# Patient Record
Sex: Male | Born: 1965
Health system: Southern US, Community
[De-identification: ages and names within clinical notes are randomized; demographics above are authoritative.]

## PROBLEM LIST (undated history)

## (undated) ENCOUNTER — Emergency Department (HOSPITAL_COMMUNITY): Discharge status: Against Medical Advice | Payer: Self-pay

## (undated) DIAGNOSIS — E785 Hyperlipidemia, unspecified: Secondary | ICD-10-CM

## (undated) DIAGNOSIS — T7840XA Allergy, unspecified, initial encounter: Secondary | ICD-10-CM

## (undated) DIAGNOSIS — Z9861 Coronary angioplasty status: Secondary | ICD-10-CM

## (undated) DIAGNOSIS — R51 Headache: Secondary | ICD-10-CM

## (undated) DIAGNOSIS — I10 Essential (primary) hypertension: Secondary | ICD-10-CM

## (undated) DIAGNOSIS — K259 Gastric ulcer, unspecified as acute or chronic, without hemorrhage or perforation: Secondary | ICD-10-CM

## (undated) DIAGNOSIS — K219 Gastro-esophageal reflux disease without esophagitis: Secondary | ICD-10-CM

## (undated) DIAGNOSIS — F32A Depression, unspecified: Secondary | ICD-10-CM

## (undated) DIAGNOSIS — F329 Major depressive disorder, single episode, unspecified: Secondary | ICD-10-CM

## (undated) DIAGNOSIS — R519 Headache, unspecified: Secondary | ICD-10-CM

## (undated) DIAGNOSIS — I2510 Atherosclerotic heart disease of native coronary artery without angina pectoris: Secondary | ICD-10-CM

## (undated) DIAGNOSIS — C801 Malignant (primary) neoplasm, unspecified: Secondary | ICD-10-CM

## (undated) HISTORY — PX: MOUTH SURGERY: SHX715

## (undated) HISTORY — DX: Depression, unspecified: F32.A

## (undated) HISTORY — DX: Gastro-esophageal reflux disease without esophagitis: K21.9

## (undated) HISTORY — DX: Malignant (primary) neoplasm, unspecified: C80.1

## (undated) HISTORY — DX: Atherosclerotic heart disease of native coronary artery without angina pectoris: I25.10

## (undated) HISTORY — DX: Headache, unspecified: R51.9

## (undated) HISTORY — DX: Allergy, unspecified, initial encounter: T78.40XA

## (undated) HISTORY — DX: Gastric ulcer, unspecified as acute or chronic, without hemorrhage or perforation: K25.9

## (undated) HISTORY — DX: Coronary angioplasty status: Z98.61

## (undated) HISTORY — DX: Headache: R51

## (undated) HISTORY — DX: Essential (primary) hypertension: I10

## (undated) HISTORY — DX: Hyperlipidemia, unspecified: E78.5

## (undated) HISTORY — DX: Major depressive disorder, single episode, unspecified: F32.9

---

## 1991-10-15 HISTORY — PX: BACK SURGERY: SHX140

## 2014-06-14 HISTORY — PX: NM MYOVIEW LTD: HXRAD82

## 2014-07-05 DIAGNOSIS — I25119 Atherosclerotic heart disease of native coronary artery with unspecified angina pectoris: Secondary | ICD-10-CM | POA: Insufficient documentation

## 2014-07-14 DIAGNOSIS — Z9861 Coronary angioplasty status: Secondary | ICD-10-CM

## 2014-07-14 DIAGNOSIS — I2510 Atherosclerotic heart disease of native coronary artery without angina pectoris: Secondary | ICD-10-CM

## 2014-07-14 HISTORY — DX: Coronary angioplasty status: Z98.61

## 2014-07-14 HISTORY — PX: CARDIAC CATHETERIZATION: SHX172

## 2014-07-14 HISTORY — PX: CORONARY STENT INTERVENTION: CATH118234

## 2014-07-14 HISTORY — DX: Atherosclerotic heart disease of native coronary artery without angina pectoris: I25.10

## 2017-04-04 ENCOUNTER — Encounter: Payer: Self-pay | Admitting: Primary Care

## 2017-04-04 ENCOUNTER — Ambulatory Visit (INDEPENDENT_AMBULATORY_CARE_PROVIDER_SITE_OTHER): Payer: BLUE CROSS/BLUE SHIELD | Admitting: Primary Care

## 2017-04-04 VITALS — BP 122/78 | HR 85 | Temp 98.0°F | Ht 71.0 in | Wt 235.0 lb

## 2017-04-04 DIAGNOSIS — Z122 Encounter for screening for malignant neoplasm of respiratory organs: Secondary | ICD-10-CM | POA: Diagnosis not present

## 2017-04-04 DIAGNOSIS — L989 Disorder of the skin and subcutaneous tissue, unspecified: Secondary | ICD-10-CM | POA: Diagnosis not present

## 2017-04-04 DIAGNOSIS — I2510 Atherosclerotic heart disease of native coronary artery without angina pectoris: Secondary | ICD-10-CM | POA: Diagnosis not present

## 2017-04-04 DIAGNOSIS — Z720 Tobacco use: Secondary | ICD-10-CM

## 2017-04-04 DIAGNOSIS — N529 Male erectile dysfunction, unspecified: Secondary | ICD-10-CM | POA: Diagnosis not present

## 2017-04-04 LAB — LIPID PANEL
Cholesterol: 216 mg/dL — ABNORMAL HIGH (ref <200)
HDL: 25 mg/dL — AB (ref >40)
Total CHOL/HDL Ratio: 8.6 Ratio — ABNORMAL HIGH (ref <5.0)
Triglycerides: 441 mg/dL — ABNORMAL HIGH (ref <150)

## 2017-04-04 LAB — COMPREHENSIVE METABOLIC PANEL
ALT: 24 U/L (ref 9–46)
AST: 19 U/L (ref 10–40)
Albumin: 4.2 g/dL (ref 3.6–5.1)
Alkaline Phosphatase: 59 U/L (ref 40–115)
BILIRUBIN TOTAL: 0.2 mg/dL (ref 0.2–1.2)
BUN: 19 mg/dL (ref 7–25)
CO2: 22 mmol/L (ref 20–31)
Calcium: 9 mg/dL (ref 8.6–10.3)
Chloride: 106 mmol/L (ref 98–110)
Creat: 1.33 mg/dL (ref 0.60–1.35)
GLUCOSE: 93 mg/dL (ref 65–99)
Potassium: 4 mmol/L (ref 3.5–5.3)
SODIUM: 139 mmol/L (ref 135–146)
Total Protein: 7 g/dL (ref 6.1–8.1)

## 2017-04-04 MED ORDER — SILDENAFIL CITRATE 20 MG PO TABS: ORAL_TABLET | ORAL | 0 refills | Status: DC

## 2017-04-04 NOTE — Progress Notes (Signed)
Subjective:    Patient ID: Miguel Cochran, male    DOB: 1966/04/11, 51 y.o.   MRN: [id number]  HPI  Miguel Cochran is a 51 year old male who presents today to establish care and discuss the problems mentioned below. Will obtain old records. 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   1) Essential Hypertension/CAD: Currently managed on lisinopril/HCTZ 20/25 mg once daily. History of cardiac catheretization in 2014 with placement of two STENT's. He is managed on aspirin daily. He endorses a poor diet and does not exercise. No recent lipid panel on file.  2) GERD: Currently managed on omeprazole 20 mg. He will experience symptoms of esophageal burning and epigastric pain with radiation to his RUQ without his medication. He feels well managed.  3) Erectile Dysfunction: He experiences difficulty obtaining and maintaining an errection that will occur nearaly every time he has intercourse. He's tried several herbal products OTC with little improvement. He's was once managed on Viagra and Cialis in the past with improvement. He would like to try sildenafil.   4) Nevus: Located to right face that has been present for the past 5-6 years. The spot has reduced and grown for several years. The spot will itch, he will scratch, and the spot will bleed. He is a current smoker since the age of 46. He smokes 1 PPD.   Review of Systems  Constitutional: Negative for unexpected weight change.  Eyes: Negative for visual disturbance.  Respiratory: Negative for shortness of breath.   Cardiovascular: Negative for chest pain.  Gastrointestinal:       GERD  Genitourinary:       Erectile dysfunction   Skin:       Skin lesion  Allergic/Immunologic: Negative for environmental allergies.  Neurological: Negative for dizziness and headaches.  Hematological: Negative for adenopathy.       Past Medical History:  Diagnosis Date  . Depression   . Frequent headaches   . GERD (gastroesophageal reflux disease)   .  Hypertension   . Stomach ulcer      Social History   Social History  . Marital status: Single    Spouse name: N/A  . Number of children: N/A  . Years of education: N/A   Occupational History  . Not on file.   Social History Main Topics  . Smoking status: Current Every Day Smoker    Packs/day: 1.50  . Smokeless tobacco: Never Used  . Alcohol use Yes  . Drug use: Unknown  . Sexual activity: Not on file   Other Topics Concern  . Not on file   Social History Narrative   Engaged.    No children.   Works as a Public affairs consultant.    Enjoys fishing, bowling, relaxing.     Past Surgical History:  Procedure Laterality Date  . BACK SURGERY  1993  . MOUTH SURGERY      Family History  Problem Relation Age of Onset  . Alcohol abuse Mother   . Hyperlipidemia Mother   . Hypertension Mother   . Alcohol abuse Father   . Lung cancer Father   . Hyperlipidemia Father   . Hypertension Maternal Grandmother   . Hypertension Maternal Grandfather     No Known Allergies  No current outpatient prescriptions on file prior to visit.   No current facility-administered medications on file prior to visit.     BP 122/78   Pulse 85   Temp 98 F (36.7 C) (Oral)   Ht  5\' 11"  (1.803 m)   Wt 235 lb (106.6 kg)   SpO2 98%   BMI 32.78 kg/m    Objective:   Physical Exam  Constitutional: He is oriented to person, place, and time. He appears well-nourished.  Neck: Neck supple.  Cardiovascular: Normal rate and regular rhythm.   Pulmonary/Chest: Effort normal and breath sounds normal.  Neurological: He is alert and oriented to person, place, and time.  Skin: Skin is warm and dry.  1 cm circular, raised, black colored lesion to right temporal lobe.   Psychiatric: He has a normal mood and affect.          Assessment & Plan:

## 2017-04-04 NOTE — Assessment & Plan Note (Signed)
36 year pack history. No alarm signs.  Not ready to quit. Referral placed for lung cancer screening program. FH of lung cancer in father.

## 2017-04-04 NOTE — Assessment & Plan Note (Signed)
Suspicious for carcinoma, especially in light of tobacco abuse. Referral placed to dermatology for further evaluation.

## 2017-04-04 NOTE — Assessment & Plan Note (Signed)
Unsure of etiology, could be medication induced.  He was successful on both Cialis and Viagra in the past. Rx for sildenafil 20 mg tablets sent to pharmacy. Good Rx card provided for discount.

## 2017-04-04 NOTE — Patient Instructions (Addendum)
Complete lab work prior to leaving today.   You will be contacted regarding your referral for the lung cancer screening and to Dermatology.  Please let us know if you have not heard back within one week.   Start Sildenafil 20 mg tablets for erectile dysfunction. Start by taking 3-5 tablets by mouth 30 minutes prior to intercourse. Consider other pharmacies if the Rx is too expensive at [facility].  I'll be in touch with you regarding your lab results.  It was a pleasure to meet you today! Please don't hesitate to call me with any questions. Welcome to [facility]!

## 2017-04-04 NOTE — Assessment & Plan Note (Signed)
History of stent placement in 2014. Currently on aspirin. Not currently on statin. BP well controlled. Check lipids today. He is asymptomatic and has been there since 2014. Discussed the importance of a healthy diet and regular exercise in order for weight loss, and to reduce the risk of other medical problems. Discussed to stop smoking.

## 2017-04-06 ENCOUNTER — Encounter: Payer: Self-pay | Admitting: Primary Care

## 2017-04-06 DIAGNOSIS — N529 Male erectile dysfunction, unspecified: Secondary | ICD-10-CM

## 2017-04-07 ENCOUNTER — Encounter: Payer: Self-pay | Admitting: Primary Care

## 2017-04-07 DIAGNOSIS — E785 Hyperlipidemia, unspecified: Secondary | ICD-10-CM

## 2017-04-07 MED ORDER — SILDENAFIL CITRATE 20 MG PO TABS: ORAL_TABLET | ORAL | 0 refills | Status: DC

## 2017-04-08 MED ORDER — ATORVASTATIN CALCIUM 20 MG PO TABS: 20.0000 mg | ORAL_TABLET | Freq: Every day | ORAL | 3 refills | Status: DC

## 2017-04-08 NOTE — Telephone Encounter (Signed)
Pt left v/m requesting generic Lipitor sent to [facility]. Pt request cb when done [phone]).

## 2017-04-08 NOTE — Telephone Encounter (Signed)
Spoken to patient and schedule lab appt on 07/11/2017

## 2017-04-08 NOTE — Telephone Encounter (Signed)
See My Chart message. Please set patient up for repeat lipids in three months, lab only, fasting appointment.

## 2017-04-24 ENCOUNTER — Other Ambulatory Visit: Payer: Self-pay | Admitting: Primary Care

## 2017-04-24 DIAGNOSIS — N529 Male erectile dysfunction, unspecified: Secondary | ICD-10-CM

## 2017-04-24 MED ORDER — SILDENAFIL CITRATE 20 MG PO TABS: ORAL_TABLET | ORAL | 0 refills | Status: DC

## 2017-05-24 ENCOUNTER — Other Ambulatory Visit: Payer: Self-pay | Admitting: Primary Care

## 2017-05-24 DIAGNOSIS — N529 Male erectile dysfunction, unspecified: Secondary | ICD-10-CM

## 2017-05-24 NOTE — Telephone Encounter (Signed)
Last filled 04/24/17--please advise

## 2017-05-25 NOTE — Telephone Encounter (Signed)
Is he taking this medication daily or just as needed? Does he need an actual refill?

## 2017-05-26 ENCOUNTER — Encounter: Payer: Self-pay | Admitting: Primary Care

## 2017-05-26 DIAGNOSIS — N529 Male erectile dysfunction, unspecified: Secondary | ICD-10-CM

## 2017-05-26 NOTE — Telephone Encounter (Signed)
Patient called back. Patient lately have been taking this medication daily. Patient needs a refill.

## 2017-05-28 MED ORDER — TADALAFIL 5 MG PO TABS: ORAL_TABLET | ORAL | 0 refills | Status: DC

## 2017-05-28 NOTE — Telephone Encounter (Signed)
See MyChart messages.

## 2017-05-29 ENCOUNTER — Ambulatory Visit (INDEPENDENT_AMBULATORY_CARE_PROVIDER_SITE_OTHER): Payer: BLUE CROSS/BLUE SHIELD | Admitting: Internal Medicine

## 2017-05-29 ENCOUNTER — Encounter: Payer: Self-pay | Admitting: Internal Medicine

## 2017-05-29 DIAGNOSIS — F32A Depression, unspecified: Secondary | ICD-10-CM | POA: Insufficient documentation

## 2017-05-29 DIAGNOSIS — F329 Major depressive disorder, single episode, unspecified: Secondary | ICD-10-CM | POA: Insufficient documentation

## 2017-05-29 DIAGNOSIS — J309 Allergic rhinitis, unspecified: Secondary | ICD-10-CM

## 2017-05-29 DIAGNOSIS — K219 Gastro-esophageal reflux disease without esophagitis: Secondary | ICD-10-CM | POA: Insufficient documentation

## 2017-05-29 DIAGNOSIS — R062 Wheezing: Secondary | ICD-10-CM | POA: Insufficient documentation

## 2017-05-29 DIAGNOSIS — J019 Acute sinusitis, unspecified: Secondary | ICD-10-CM | POA: Diagnosis not present

## 2017-05-29 DIAGNOSIS — I10 Essential (primary) hypertension: Secondary | ICD-10-CM | POA: Insufficient documentation

## 2017-05-29 MED ORDER — HYDROCODONE-HOMATROPINE 5-1.5 MG/5ML PO SYRP: 5.0000 mL | ORAL_SOLUTION | Freq: Four times a day (QID) | ORAL | 0 refills | Status: AC | PRN

## 2017-05-29 MED ORDER — LEVOFLOXACIN 500 MG PO TABS: 500.0000 mg | ORAL_TABLET | Freq: Every day | ORAL | 0 refills | Status: AC

## 2017-05-29 MED ORDER — METHYLPREDNISOLONE ACETATE 80 MG/ML IJ SUSP
80.0000 mg | Freq: Once | INTRAMUSCULAR | Status: AC
  Administered 2017-05-29: 80 mg via INTRAMUSCULAR

## 2017-05-29 MED ORDER — PREDNISONE 10 MG PO TABS: ORAL_TABLET | ORAL | 0 refills | Status: DC

## 2017-05-29 MED ORDER — ALBUTEROL SULFATE HFA 108 (90 BASE) MCG/ACT IN AERS: 2.0000 | INHALATION_SPRAY | Freq: Four times a day (QID) | RESPIRATORY_TRACT | 2 refills | Status: DC | PRN

## 2017-05-29 NOTE — Progress Notes (Signed)
Subjective:    Patient ID: Miguel Cochran, male    DOB: 09-03-1966, 51 y.o.   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 Yesterday installed one and the situation was an older car with massive dust and debris involved more than usual.  Did not wear any mask or respirator b/c not normally required.  Immediately had onset large sinus and facial congestoin, pressure, drainage, cough and mild wheezing without sob  Not better with zyrtec, xyzal, benadryl and nasacort.  Nothing else makes better or worse.  Believes he likely had a fever earlier today.   Past Medical History:  Diagnosis Date  . Depression   . Frequent headaches   . GERD (gastroesophageal reflux disease)   . Hypertension   . Stomach ulcer    Past Surgical History:  Procedure Laterality Date  . BACK SURGERY  1993  . MOUTH SURGERY      reports that he has been smoking.  He has been smoking about 1.50 packs per day. He has never used smokeless tobacco. He reports that he drinks alcohol. His drug history is not on file. family history includes Alcohol abuse in his father and mother; Hyperlipidemia in his father and mother; Hypertension in his maternal grandfather, maternal grandmother, and mother; Lung cancer in his father. No Known Allergies Current Outpatient Prescriptions on File Prior to Visit  Medication Sig Dispense Refill  . Aspirin-Salicylamide-Caffeine (BC HEADACHE POWDER PO) Take by mouth.    Marland Kitchen atorvastatin (LIPITOR) 20 MG tablet Take 1 tablet (20 mg total) by mouth daily. 90 tablet 3  . b complex vitamins tablet Take 1 tablet by mouth daily.    Marland Kitchen lisinopril-hydrochlorothiazide (PRINZIDE,ZESTORETIC) 20-25 MG tablet Take 1 tablet by mouth daily.    Marland Kitchen omeprazole (PRILOSEC) 20 MG capsule Take 20 mg by mouth daily.    . tadalafil (CIALIS) 5 MG tablet Take 1/2  tablet by mouth daily for erectile dysfunction. May increase to 1 full tablet daily after one week if needed. 30 tablet 0   No current facility-administered medications on file prior to visit.    Review of Systems  Constitutional: Negative for other unusual diaphoresis or sweats HENT: Negative for ear discharge or swelling Eyes: Negative for other worsening visual disturbances Respiratory: Negative for stridor or other swelling  Gastrointestinal: Negative for worsening distension or other blood Genitourinary: Negative for retention or other urinary change Musculoskeletal: Negative for other MSK pain or swelling Skin: Negative for color change or other new lesions Neurological: Negative for worsening tremors and other numbness  Psychiatric/Behavioral: Negative for worsening agitation or other fatigue All other system neg per pt    Objective:   Physical Exam BP 118/82   Pulse 84   Temp 97.7 F (36.5 C)   Ht 5\' 11"  (1.803 m)   Wt 244 lb (110.7 kg)   SpO2 99%   BMI 34.03 kg/m  VS noted, mild ill Constitutional: Pt appears in NAD HENT: Head: NCAT.  Right Ear: External ear normal.  Left Ear: External ear normal.  Eyes: . Pupils are equal, round, and reactive to light. Conjunctivae and EOM are normal Nose: without d/c or deformity Bilat tm's with mild erythema.  Max sinus areas mild tender.  Pharynx with mild erythema, no exudate Neck: Neck supple. Gross normal ROM Cardiovascular: Normal rate and regular rhythm.  Pulmonary/Chest: Effort normal and breath sounds decresaed without rales but with few mild scattered wheezing.  Neurological: Pt is alert. At baseline orientation, motor grossly intact Skin: Skin is warm. No rashes, other new lesions, no LE edema Psychiatric: Pt behavior is normal without agitation  No other exam findings    Assessment & Plan:

## 2017-05-29 NOTE — Patient Instructions (Addendum)
You had the steroid shot today  Please take all new medication as prescribed - the antibiotic, prednisone, cough medicine, and inhaler if needed  Please continue all other medications as before, and refills have been done if requested.  Please have the pharmacy call with any other refills you may need.  Please keep your appointments with your specialists as you may have planned

## 2017-05-29 NOTE — Assessment & Plan Note (Signed)
Mild to mod, for antibx course,  to f/u any worsening symptoms or concerns 

## 2017-05-29 NOTE — Assessment & Plan Note (Addendum)
Mod to severe, for depomedrol 80 IM,  to f/u any worsening symptoms or concerns

## 2017-05-29 NOTE — Assessment & Plan Note (Signed)
Mild, for predpac asd,  to f/u any worsening symptoms or concerns 

## 2017-05-30 ENCOUNTER — Encounter: Payer: Self-pay | Admitting: Primary Care

## 2017-06-05 ENCOUNTER — Telehealth: Payer: Self-pay | Admitting: Primary Care

## 2017-06-05 ENCOUNTER — Encounter: Payer: Self-pay | Admitting: Primary Care

## 2017-06-05 NOTE — Telephone Encounter (Signed)
Received [employer] form, completed and messaged patient via My Chart that this is ready for pick up.

## 2017-06-14 ENCOUNTER — Encounter: Payer: Self-pay | Admitting: Primary Care

## 2017-06-15 ENCOUNTER — Emergency Department (HOSPITAL_COMMUNITY): Payer: BLUE CROSS/BLUE SHIELD

## 2017-06-15 ENCOUNTER — Encounter (HOSPITAL_COMMUNITY): Payer: Self-pay | Admitting: Emergency Medicine

## 2017-06-15 ENCOUNTER — Emergency Department (HOSPITAL_COMMUNITY)
Admission: EM | Admit: 2017-06-15 | Discharge: 2017-06-16 | Disposition: A | Discharge status: Home / Self Care | Payer: BLUE CROSS/BLUE SHIELD | Attending: Emergency Medicine | Admitting: Emergency Medicine

## 2017-06-15 DIAGNOSIS — F17200 Nicotine dependence, unspecified, uncomplicated: Secondary | ICD-10-CM | POA: Diagnosis not present

## 2017-06-15 DIAGNOSIS — Z79899 Other long term (current) drug therapy: Secondary | ICD-10-CM | POA: Diagnosis not present

## 2017-06-15 DIAGNOSIS — I10 Essential (primary) hypertension: Secondary | ICD-10-CM | POA: Diagnosis not present

## 2017-06-15 DIAGNOSIS — R55 Syncope and collapse: Secondary | ICD-10-CM | POA: Insufficient documentation

## 2017-06-15 DIAGNOSIS — Z5329 Procedure and treatment not carried out because of patient's decision for other reasons: Secondary | ICD-10-CM | POA: Diagnosis not present

## 2017-06-15 DIAGNOSIS — I2510 Atherosclerotic heart disease of native coronary artery without angina pectoris: Secondary | ICD-10-CM | POA: Diagnosis not present

## 2017-06-15 DIAGNOSIS — Z955 Presence of coronary angioplasty implant and graft: Secondary | ICD-10-CM | POA: Diagnosis not present

## 2017-06-15 DIAGNOSIS — R0602 Shortness of breath: Secondary | ICD-10-CM | POA: Insufficient documentation

## 2017-06-15 LAB — URINALYSIS, ROUTINE W REFLEX MICROSCOPIC
Bilirubin Urine: NEGATIVE
Glucose, UA: NEGATIVE mg/dL
HGB URINE DIPSTICK: NEGATIVE
KETONES UR: NEGATIVE mg/dL
LEUKOCYTES UA: NEGATIVE
Nitrite: NEGATIVE
PROTEIN: NEGATIVE mg/dL
Specific Gravity, Urine: 1.012 (ref 1.005–1.030)
pH: 7 (ref 5.0–8.0)

## 2017-06-15 LAB — BASIC METABOLIC PANEL
Anion gap: 10 (ref 5–15)
BUN: 19 mg/dL (ref 6–20)
CALCIUM: 8.7 mg/dL — AB (ref 8.9–10.3)
CO2: 22 mmol/L (ref 22–32)
CREATININE: 1.14 mg/dL (ref 0.61–1.24)
Chloride: 104 mmol/L (ref 101–111)
GFR calc non Af Amer: >60 mL/min (ref >60)
Glucose, Bld: 123 mg/dL — ABNORMAL HIGH (ref 65–99)
Potassium: 3.7 mmol/L (ref 3.5–5.1)
SODIUM: 136 mmol/L (ref 135–145)

## 2017-06-15 LAB — TROPONIN I

## 2017-06-15 LAB — D-DIMER, QUANTITATIVE: D-Dimer, Quant: <0 ug/mL-FEU — ABNORMAL LOW (ref 0.00–0.50)

## 2017-06-15 LAB — RAPID URINE DRUG SCREEN, HOSP PERFORMED
AMPHETAMINES: NOT DETECTED
Barbiturates: NOT DETECTED
Benzodiazepines: NOT DETECTED
Cocaine: NOT DETECTED
OPIATES: NOT DETECTED
Tetrahydrocannabinol: POSITIVE — AB

## 2017-06-15 LAB — CBC
HCT: 44 % (ref 39.0–52.0)
Hemoglobin: 15.7 g/dL (ref 13.0–17.0)
MCH: 33.6 pg (ref 26.0–34.0)
MCHC: 35.7 g/dL (ref 30.0–36.0)
MCV: 94.2 fL (ref 78.0–100.0)
PLATELETS: 219 10*3/uL (ref 150–400)
RBC: 4.67 MIL/uL (ref 4.22–5.81)
RDW: 12.8 % (ref 11.5–15.5)
WBC: 11.4 10*3/uL — AB (ref 4.0–10.5)

## 2017-06-15 NOTE — ED Notes (Signed)
Out of bed to BY for UA

## 2017-06-15 NOTE — ED Notes (Signed)
Received report on pt, pt updated on plan of care, fan provided for comfort due to heat in room

## 2017-06-15 NOTE — ED Notes (Signed)
Out of bed for BR upon return HR 86  Then decreased to 77 and up to 83

## 2017-06-15 NOTE — ED Provider Notes (Signed)
[facility] DEPT Provider Note   CSN: [id number] Arrival date & time: 06/15/17  1943     History   Chief Complaint Chief Complaint  Patient presents with  . Loss of Consciousness    HPI Miguel Cochran is a 51 y.o. male.  HPI   Miguel Cochran is a 51 y.o. male with hx of CAD with stent placed,  presents to the Emergency Department complaining of intermittent, brief episodes of "passing out" symptoms present for two weeks.  He states that he feels his heart beat slow down just before the episodes occur and it speeds up and associated with some shortness of breath.  Episodes have been witnessed by his significant other.  He describes them as lasting from a few seconds to a minute.  He states that he has never experienced episodes like this before.  He also admits to working excessively long hours for the last two weeks.  He denies drug or alcohol use, chest pain, numbness or weakness of the extremities.      Past Medical History:  Diagnosis Date  . Coronary artery disease   . Depression   . Frequent headaches   . GERD (gastroesophageal reflux disease)   . History of cardiac cath   . Hypertension   . Stomach ulcer     Patient Active Problem List   Diagnosis Date Noted  . Acute sinus infection 05/29/2017  . Allergic rhinitis 05/29/2017  . Wheezing 05/29/2017  . GERD (gastroesophageal reflux disease)   . Depression   . Hypertension   . Coronary artery disease involving native heart without angina pectoris 04/04/2017  . Erectile dysfunction 04/04/2017  . Skin lesion of face 04/04/2017  . Tobacco abuse 04/04/2017    Past Surgical History:  Procedure Laterality Date  . BACK SURGERY  1993  . CARDIAC SURGERY     2 stents  . MOUTH SURGERY         Home Medications    Prior to Admission medications   Medication Sig Start Date End Date Taking? Authorizing Provider  albuterol (PROVENTIL HFA;VENTOLIN HFA) 108 (90 Base) MCG/ACT inhaler Inhale 2 puffs into the lungs  every 6 (six) hours as needed for wheezing or shortness of breath. 05/29/17  Yes Biagio Borg, MD  Aspirin-Salicylamide-Caffeine [facility] HEADACHE POWDER PO) Take 1 packet by mouth daily.    Yes [provider]  atorvastatin (LIPITOR) 20 MG tablet Take 1 tablet (20 mg total) by mouth daily. 04/08/17  Yes Pleas Koch, NP  b complex vitamins tablet Take 1 tablet by mouth daily.   Yes [provider]  lisinopril-hydrochlorothiazide (PRINZIDE,ZESTORETIC) 20-25 MG tablet Take 1 tablet by mouth daily.   Yes [provider]  omeprazole (PRILOSEC) 20 MG capsule Take 20 mg by mouth daily.   Yes [provider]  predniSONE (DELTASONE) 10 MG tablet 3 tabs by mouth per day for 3 days,2tabs per day for 3 days,1tab per day for 3 days Patient not taking: Reported on 06/15/2017 05/29/17   Biagio Borg, MD  tadalafil (CIALIS) 5 MG tablet Take 1/2 tablet by mouth daily for erectile dysfunction. May increase to 1 full tablet daily after one week if needed. Patient taking differently: Take 2.5-5 mg by mouth daily as needed for erectile dysfunction. Take 1/2 tablet by mouth daily for erectile dysfunction. May increase to 1 full tablet daily after one week if needed. 05/28/17   Pleas Koch, NP    Family History Family History  Problem Relation  Age of Onset  . Alcohol abuse Mother   . Hyperlipidemia Mother   . Hypertension Mother   . Alcohol abuse Father   . Lung cancer Father   . Hyperlipidemia Father   . Hypertension Maternal Grandmother   . Hypertension Maternal Grandfather     Social History Social History  Substance Use Topics  . Smoking status: Current Every Day Smoker    Packs/day: 1.00  . Smokeless tobacco: Never Used  . Alcohol use No     Allergies   Patient has no known allergies.   Review of Systems Review of Systems  Constitutional: Negative for activity change, appetite change and chills.  Eyes: Negative for visual disturbance.  Respiratory:  Positive for shortness of breath. Negative for cough and chest tightness.   Cardiovascular: Negative for chest pain.  Gastrointestinal: Negative for abdominal pain, nausea and vomiting.  Genitourinary: Negative for dysuria and flank pain.  Musculoskeletal: Negative for arthralgias, neck pain and neck stiffness.  Skin: Negative for rash.  Neurological: Positive for syncope. Negative for dizziness, tremors, weakness, numbness and headaches.  Psychiatric/Behavioral: Negative for confusion and decreased concentration. The patient is not nervous/anxious.      Physical Exam Updated Vital Signs BP 116/72   Pulse 82   Temp 98.7 F (37.1 C) (Oral)   Resp 19   Ht 5\' 11"  (1.803 m)   Wt 110.7 kg (244 lb)   SpO2 96%   BMI 34.03 kg/m   Physical Exam  Constitutional: He is oriented to person, place, and time. He appears well-developed and well-nourished. No distress.  HENT:  Head: Atraumatic.  Mouth/Throat: Oropharynx is clear and moist.  Eyes: Pupils are equal, round, and reactive to light. EOM are normal.  Neck: Normal range of motion. Neck supple. No JVD present.  Cardiovascular: Normal rate, regular rhythm, normal heart sounds and intact distal pulses.  Exam reveals no friction rub.   No murmur heard. Pulmonary/Chest: Effort normal and breath sounds normal. No respiratory distress.  Abdominal: Soft. He exhibits no distension and no mass. There is no tenderness. There is no guarding.  Musculoskeletal: Normal range of motion.  Lymphadenopathy:    He has no cervical adenopathy.  Neurological: He is alert and oriented to person, place, and time. He has normal strength. No sensory deficit. Gait normal. GCS eye subscore is 4. GCS verbal subscore is 5. GCS motor subscore is 6.  CN III-XII intact  Skin: Skin is warm. Capillary refill takes less than 2 seconds. No rash noted.  Psychiatric: He has a normal mood and affect.  Nursing note and vitals reviewed.    ED Treatments / Results   Labs (all labs ordered are listed, but only abnormal results are displayed) Labs Reviewed  BASIC METABOLIC PANEL - Abnormal; Notable for the following:       Result Value   Glucose, Bld 123 (*)    Calcium 8.7 (*)    All other components within normal limits  CBC - Abnormal; Notable for the following:    WBC 11.4 (*)    All other components within normal limits  RAPID URINE DRUG SCREEN, HOSP PERFORMED - Abnormal; Notable for the following:    Tetrahydrocannabinol POSITIVE (*)    All other components within normal limits  D-DIMER, QUANTITATIVE (NOT AT [facility]) - Abnormal; Notable for the following:    D-Dimer, Quant <0.00 (*)    All other components within normal limits  URINALYSIS, ROUTINE W REFLEX MICROSCOPIC  TROPONIN I    EKG  EKG  Interpretation  Date/Time:  Sunday June 15 2017 21:11:59 EDT Ventricular Rate:  78 PR Interval:    QRS Duration: 91 QT Interval:  400 QTC Calculation: 456 R Axis:   -6 Text Interpretation:  Sinus rhythm Low voltage, precordial leads Abnormal R-wave progression, early transition Baseline wander No old tracing to compare Confirmed by Francine Graven [phone]) on 06/15/2017 10:21:08 PM       Radiology Dg Chest 2 View  Result Date: 06/15/2017 CLINICAL DATA:  Recurrent syncope. EXAM: CHEST  2 VIEW COMPARISON:  None. FINDINGS: The cardiomediastinal contours are normal. The lungs are clear. Pulmonary vasculature is normal. No consolidation, pleural effusion, or pneumothorax. No acute osseous abnormalities are seen. IMPRESSION: No acute pulmonary process. Electronically Signed   By: Jeb Levering M.D.   On: 06/15/2017 23:20   Ct Head Wo Contrast  Result Date: 06/15/2017 CLINICAL DATA:  Recurrent syncope. EXAM: CT HEAD WITHOUT CONTRAST TECHNIQUE: Contiguous axial images were obtained from the base of the skull through the vertex without intravenous contrast. COMPARISON:  None. FINDINGS: Brain: No intracranial hemorrhage, mass effect, or midline shift.  No hydrocephalus. The basilar cisterns are patent. No evidence of territorial infarct or acute ischemia. No extra-axial or intracranial fluid collection. Vascular: Atherosclerosis of skullbase vasculature without hyperdense vessel or abnormal calcification. Skull: Normal. Negative for fracture or focal lesion. Sinuses/Orbits: Paranasal sinuses and mastoid air cells are clear. The visualized orbits are unremarkable. Other: None. IMPRESSION: No acute intracranial abnormality. Intracranial atherosclerosis, incidentally noted. Electronically Signed   By: Jeb Levering M.D.   On: 06/15/2017 23:07    Procedures Procedures (including critical care time)  Medications Ordered in ED Medications - No data to display   Initial Impression / Assessment and Plan / ED Course  I have reviewed the triage vital signs and the nursing notes.  Pertinent labs & imaging results that were available during my care of the patient were reviewed by me and considered in my medical decision making (see chart for details).     Pt is well appearing.  Vitals wnml.  He has ambulated to the restroom w/o difficulty  0030  Consulted Dr. Radford Pax with cardiology.  She recommends pt to be admitted for monitor and cards f/u on Tuesday,.  0035 Discussed recommendation with patient and he refuses admission stating that he has to work tomorrow and cannot stay.  I have offered a work note and thoroughly discussed risks involved with leaving including death. Pt verbalized understanding and states that he will call his PCP and arrange cardiology appt.    Final Clinical Impressions(s) / ED Diagnoses   Final diagnoses:  Syncope, unspecified syncope type    New Prescriptions New Prescriptions   No medications on file     Kem Parkinson, PA-C 06/16/17 0111    Francine Graven, DO 06/18/17 1948

## 2017-06-15 NOTE — ED Triage Notes (Signed)
Pt states that he has had multiple syncopal episodes in the last 2 weeks

## 2017-06-15 NOTE — ED Notes (Signed)
Pt reports that he has been passing out several times a day for the last several weeks- Often while driving- he reports "zoning out" and when driving, and speaking with his SO He feels as though his heart races Today he texted his cardiologist office and the PA there suggested he come to the ED for evaluation

## 2017-06-15 NOTE — ED Notes (Signed)
From radiology 

## 2017-06-16 NOTE — ED Notes (Signed)
Tammy PA at bedside speaking with pt

## 2017-06-16 NOTE — ED Notes (Signed)
Pt verbalizes understanding of risks of leaving AMA. Pt alert and oriented x 4, denies pain or dizziness, steady gait.

## 2017-06-16 NOTE — ED Notes (Signed)
Tammy PA at bedside

## 2017-06-16 NOTE — ED Notes (Signed)
IV access 20 gauge to left wrist discontinued- cath intact- bleeding controlled.

## 2017-06-17 ENCOUNTER — Telehealth: Payer: Self-pay

## 2017-06-17 NOTE — Telephone Encounter (Signed)
It sounds like he needs to see cardiology. Can we get him rescheduled for this week? Rosaria Ferries, see note below. Do we need a new referral?

## 2017-06-17 NOTE — Telephone Encounter (Signed)
Pt left v/m requesting cb about getting a cardiology referral. Please seen [facility] ED note on 09/*02/18. Pt was advised at [facility] ED that Dr Radford Pax cardiologist had advised pt to be admitted and f/u with cards on 06/17/17. Pt could not miss work and left AMA. Do you want to see pt before card referral or what to do? Pt could not come in to see Allie Bossier NP until 06/20/17.Please advise.

## 2017-06-18 NOTE — Telephone Encounter (Signed)
Yes we will need a Cardiology REferral placed for this patient, then we can call around to schedule. Please place referral.

## 2017-06-19 ENCOUNTER — Other Ambulatory Visit: Payer: Self-pay | Admitting: Primary Care

## 2017-06-19 DIAGNOSIS — R55 Syncope and collapse: Secondary | ICD-10-CM

## 2017-06-19 NOTE — Telephone Encounter (Signed)
Noted, referral placed. Thanks!

## 2017-06-19 NOTE — Telephone Encounter (Signed)
Called patient and had to [facility]

## 2017-06-22 ENCOUNTER — Encounter: Payer: Self-pay | Admitting: Primary Care

## 2017-06-22 DIAGNOSIS — N529 Male erectile dysfunction, unspecified: Secondary | ICD-10-CM

## 2017-06-24 NOTE — Telephone Encounter (Signed)
Patient is requesting refill.  Also I have place the form in Miguel Cochran's inbox

## 2017-06-26 MED ORDER — TADALAFIL 5 MG PO TABS: ORAL_TABLET | ORAL | 5 refills | Status: DC

## 2017-06-26 NOTE — Telephone Encounter (Signed)
Pt left v/m requesting cb [phone] about question sent on sildenafil refill. Please advise.

## 2017-06-26 NOTE — Telephone Encounter (Signed)
Form completed and placed on Chan's desk. Looks like he needs to complete a section.

## 2017-06-30 ENCOUNTER — Encounter: Payer: Self-pay | Admitting: Primary Care

## 2017-06-30 ENCOUNTER — Other Ambulatory Visit: Payer: Self-pay | Admitting: Primary Care

## 2017-06-30 DIAGNOSIS — N529 Male erectile dysfunction, unspecified: Secondary | ICD-10-CM

## 2017-06-30 MED ORDER — TADALAFIL 5 MG PO TABS: ORAL_TABLET | ORAL | 3 refills | Status: DC

## 2017-06-30 MED ORDER — SILDENAFIL CITRATE 20 MG PO TABS: ORAL_TABLET | ORAL | 0 refills | Status: DC

## 2017-06-30 NOTE — Telephone Encounter (Signed)
Pt left v/m requesting cb at [phone].

## 2017-06-30 NOTE — Telephone Encounter (Signed)
Pt left vm requesting cb on [phone].

## 2017-06-30 NOTE — Addendum Note (Signed)
Addended by: Jacqualin Combes on: 06/30/2017 01:01 PM   Modules accepted: Orders

## 2017-06-30 NOTE — Telephone Encounter (Signed)
Can you please provide him with an update? Thanks.

## 2017-07-01 NOTE — Telephone Encounter (Signed)
Already respond to patient regarding this form

## 2017-07-11 ENCOUNTER — Other Ambulatory Visit (INDEPENDENT_AMBULATORY_CARE_PROVIDER_SITE_OTHER): Payer: BLUE CROSS/BLUE SHIELD

## 2017-07-11 DIAGNOSIS — E785 Hyperlipidemia, unspecified: Secondary | ICD-10-CM | POA: Diagnosis not present

## 2017-07-11 LAB — LIPID PANEL
CHOL/HDL RATIO: 4
Cholesterol: 138 mg/dL (ref 0–200)
HDL: 34.1 mg/dL — ABNORMAL LOW (ref >39.00)
LDL CALC: 72 mg/dL (ref 0–99)
NonHDL: 104.21
Triglycerides: 160 mg/dL — ABNORMAL HIGH (ref 0.0–149.0)
VLDL: 32 mg/dL (ref 0.0–40.0)

## 2017-07-11 LAB — HEPATIC FUNCTION PANEL
ALT: 46 U/L (ref 0–53)
AST: 27 U/L (ref 0–37)
Albumin: 4.1 g/dL (ref 3.5–5.2)
Alkaline Phosphatase: 60 U/L (ref 39–117)
BILIRUBIN DIRECT: 0.1 mg/dL (ref 0.0–0.3)
BILIRUBIN TOTAL: 0.3 mg/dL (ref 0.2–1.2)
Total Protein: 7.2 g/dL (ref 6.0–8.3)

## 2017-07-18 ENCOUNTER — Ambulatory Visit (INDEPENDENT_AMBULATORY_CARE_PROVIDER_SITE_OTHER): Payer: BLUE CROSS/BLUE SHIELD | Admitting: Cardiology

## 2017-07-18 ENCOUNTER — Encounter: Payer: Self-pay | Admitting: Cardiology

## 2017-07-18 VITALS — BP 128/78 | HR 76 | Ht 71.0 in | Wt 248.0 lb

## 2017-07-18 DIAGNOSIS — E785 Hyperlipidemia, unspecified: Secondary | ICD-10-CM

## 2017-07-18 DIAGNOSIS — Z720 Tobacco use: Secondary | ICD-10-CM | POA: Diagnosis not present

## 2017-07-18 DIAGNOSIS — R55 Syncope and collapse: Secondary | ICD-10-CM | POA: Diagnosis not present

## 2017-07-18 DIAGNOSIS — G4719 Other hypersomnia: Secondary | ICD-10-CM | POA: Insufficient documentation

## 2017-07-18 DIAGNOSIS — I25119 Atherosclerotic heart disease of native coronary artery with unspecified angina pectoris: Secondary | ICD-10-CM

## 2017-07-18 DIAGNOSIS — I10 Essential (primary) hypertension: Secondary | ICD-10-CM

## 2017-07-18 NOTE — Progress Notes (Signed)
PCP: Miguel Koch, NP, Miguel Cower, MD  Clinic Note: Chief Complaint  Patient presents with  . New Patient (Initial Visit)  . Coronary Artery Disease  . Sleeping Problem    ? OSA    HPI: Miguel Cochran is a 51 y.o. male who is being seen today for the evaluation of poor sleep & h/o CAD-PCI (in [place]). at the request of Miguel Koch, NP. - He has a history of CAD noted back in 2015 when he had a stress test performed for exertional dyspnea and chest pain. Following this he underwent PCI to OM 2 & has pRCA CTO. --> Was essentially lost to follow-up after one visit following PCI. - He has hypertension and erectile dysfunction  Miguel Cochran was first seen by his Miguel Koch, NP on June 22nd 2 establish care.  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  Recent Hospitalizations: ER visit for (Near) syncope 06/15/2017 --> noted intermittent brief episodes of "passing out" lasting only a few seconds.  Says that his HR seems to slow before these spell kick in -- no Sx.  Was working long/ difficult shifts @ work.  Studies Personally Reviewed - (if available, images/films reviewed: From Epic Chart or Care Everywhere)   Myoview September 2015 1.Abnormal exercise stress and rest myocardial perfusion images. Large reversible defect of moderate severity involving the basal and mid inferolateral walls, basal and mid inferior walls and inferoapex consistent with ischemia. 2.Normal LV wall motion with LVEF 55%. 3.Diminished exercise capacity with chest pain. 4.Nonspecific ECG response to exercise. 5.Consider cardiac catheterization   07/2014 Cath-PCI: Right dominant - pRCA 100%. OM2 90%.  EF 55%.  Normal Wall motion. PCI Lesion: 2nd Marginal 90% to normal: MINI TREK RX 2.0X8MM --> STENT, PROMUS PREMIER MR 2.50X8MM & STENT, XIENCE ALPHINE RX 2.5X8 -- he indicated that he only took Plavix for 6 months   Interval History: Miguel Cochran presents here today  they suggested to establish cardiology care here in [place]. Since his PCI, he has not had any further chest tightness or pressure episodes with either rest or exertion. He denies any exertional dyspnea. He also denies any PND, orthopnea or edema.  Prior to the episode back in September he had not had any syncope or near syncopal episodes either. Her new and unusual symptoms to him but he felt like they may be related to a significant amount of excessive work and stress. He has been having issues with feeling his heart racing, but it would appear that this was not associated with his near-syncopal episodes asked month. Contact those were associated with slowing of heart rate.  He described the symptoms is sort of waves of dizziness that would come and go in cycles. They've been building up over about a week but got to the point where his significant other was concerned and requested that he go to the hospital.  He has not had any further episodes like to that he had in September. He's been trying to do a better job eating and drinking correctly and getting better sleep. The one thing he does note however his disease falls asleep very easily has excessive daytime sleepiness and falls asleep at the [place] times. Whenever he is sitting still, and comfortable with rest watching TV or movie or even during conversation he will fall sleep. He has not yet fallen asleep at the wheel, however. Probably as a result of his difficult sleeping issues, he notes significant  fatigue. He also has issues with erectile dysfunction for which he takes sildenafil. He was recently started back on atorvastatin by his new PCP along with aspirin.  Remainder of cardiac review of symptoms: No TIA/amaurosis fugax symptoms.  No claudication.  ROS: A comprehensive was performed. Review of Systems  Constitutional: Positive for malaise/fatigue.  HENT: Negative for congestion, nosebleeds and sinus pain.   Respiratory: Positive  for cough. Negative for shortness of breath and wheezing.   Cardiovascular:       Per history of present illness  Gastrointestinal: Positive for heartburn. Negative for blood in stool and melena.  Genitourinary: Negative for frequency and hematuria.  Musculoskeletal: Negative for joint pain and myalgias ( he does note cramping).  Neurological: Positive for loss of consciousness (Per history of present illness - he doesn't think he truly lost consciousness).  Endo/Heme/Allergies: Positive for environmental allergies (No active symptoms). Does not bruise/bleed easily.  Psychiatric/Behavioral: Negative for depression. The patient has insomnia (Poor sleep).   All other systems reviewed and are negative.  I have reviewed and (if needed) personally updated the patient's problem list, medications, allergies, past medical and surgical history, social and family history.   Past Medical History:  Diagnosis Date  . CAD S/P percutaneous coronary angioplasty 07/2014   [facility]: Abnormal Nuc --> Cath: pRCA 100% CTO (Med Rx), 90% OM2 --> PCI with 2 overlapping 2.5 x 8 DES (Promus & Xience)  . Depression   . Frequent headaches   . GERD (gastroesophageal reflux disease)   . Hypertension   . Stomach ulcer     Past Surgical History:  Procedure Laterality Date  . BACK SURGERY  1993  . CARDIAC CATHETERIZATION  07/2014   [facility]: EF 55%. pRCA 100% CTO (Med Rx). OM2 90% -PCI   . CORONARY STENT INTERVENTION  07/2014   [facility]: OM2: Xience DES 2.5 x 8 & Promus DES 2.5 x 8 (? overlapping)  . MOUTH SURGERY    . NM MYOVIEW LTD  06/2014   [facility]:  EF 55%, diminished exercise capacity w/ Angina. Large size, moderate severity reversible perfusion defect involving the basal and mid inferolateral walls, basal and mid inferior walls and inferoapex consistent with ischemia. --> Consider Cardiac Cath (done 07/2014)    Current Meds  Medication Sig  . Aspirin-Salicylamide-Caffeine (BC  HEADACHE POWDER PO) Take 1 packet by mouth daily.   Marland Kitchen atorvastatin (LIPITOR) 20 MG tablet Take 1 tablet (20 mg total) by mouth daily.  Marland Kitchen b complex vitamins tablet Take 1 tablet by mouth daily.  Marland Kitchen lisinopril-hydrochlorothiazide (PRINZIDE,ZESTORETIC) 20-25 MG tablet Take 1 tablet by mouth daily.  Marland Kitchen omeprazole (PRILOSEC) 20 MG capsule Take 20 mg by mouth daily.  . sildenafil (REVATIO) 20 MG tablet Take 2-5 tablets by mouth 30 minutes prior to intercourse.    No Known Allergies  Social History   Social History  . Marital status: Single    Spouse name: N/A  . Number of children: N/A  . Years of education: N/A   Social History Main Topics  . Smoking status: Current Every Day Smoker    Packs/day: 1.00  . Smokeless tobacco: Never Used  . Alcohol use No  . Drug use: No  . Sexual activity: Not Asked   Other Topics Concern  . None   Social History Narrative   Engaged.    No children.   Works as a Public affairs consultant.    Enjoys fishing, bowling, relaxing.     family history includes Alcohol abuse  in his father and mother; Hyperlipidemia in his father and mother; Hypertension in his maternal grandfather, maternal grandmother, and mother; Lung cancer in his father.  Wt Readings from Last 3 Encounters:  07/18/17 248 lb (112.5 kg)  06/15/17 244 lb (110.7 kg)  05/29/17 244 lb (110.7 kg)    PHYSICAL EXAM BP 128/78   Pulse 76   Ht 5\' 11"  (1.803 m)   Wt 248 lb (112.5 kg)   BMI 34.59 kg/m   Orthostatic Vitals: supine: 120/75 mmHg P 73; Sitting: 128/80 mmHg P 84; Initial Standing: 131/59mmHg P 85; Standing 2-5 min: 122/79 mmHg P 89 Physical Exam  Constitutional: He is oriented to person, place, and time. He appears well-developed and well-nourished. No distress.  Healthy-appearing. Mildly discheveled  HENT:  Head: Normocephalic and atraumatic.  Nose: Nose normal.  Mouth/Throat: Oropharynx is clear and moist. No oropharyngeal exudate.  Edentulous  Eyes: Pupils are equal, round, and  reactive to light. EOM are normal. No scleral icterus.  Neck: Normal range of motion. Neck supple. No hepatojugular reflux and no JVD present. Carotid bruit is not present.  Cardiovascular: Normal rate and regular rhythm.   Occasional extrasystoles are present. PMI is not displaced.  Exam reveals distant heart sounds and decreased pulses (Pedal pulses). Exam reveals no gallop.   No murmur heard. Pulmonary/Chest: Effort normal and breath sounds normal. No respiratory distress. He has no wheezes. He has no rales.  Mild interstitial sounds  Abdominal: Soft. Bowel sounds are normal. He exhibits no distension. There is no tenderness. There is no rebound.  Musculoskeletal: Normal range of motion. He exhibits no edema.  Neurological: He is alert and oriented to person, place, and time. No cranial nerve deficit.  Skin: Skin is dry. No rash noted. No erythema. No pallor.  Psychiatric: He has a normal mood and affect. His behavior is normal. Judgment and thought content normal.  Nursing note and vitals reviewed.   Adult ECG Report Not checked  Other studies Reviewed: Additional studies/ records that were reviewed today include:  Recent Labs:   Lab Results  Component Value Date   CHOL 138 07/11/2017   HDL 34.10 (L) 07/11/2017   LDLCALC 72 07/11/2017   TRIG 160.0 (H) 07/11/2017   CHOLHDL 4 07/11/2017    Lab Results  Component Value Date   CREATININE 1.14 06/15/2017   BUN 19 06/15/2017   NA 136 06/15/2017   K 3.7 06/15/2017   CL 104 06/15/2017   CO2 22 06/15/2017    ASSESSMENT / PLAN: Problem List Items Addressed This Visit    Coronary artery disease involving native coronary artery of native heart with angina pectoris ([place]) - Primary (Chronic)    S/p PCI Om2 in Oct 2015 with known RCA CTO --> was essentially lost to follow-up and only on aspirin. Now started on statin by PCP. Thankfully, he has been astigmatic since 2015. Partly because of his concerns with slow heart rates prior to  his near syncopal spells, I'm reluctant to use a beta blocker. He is on ACE inhibitor.  For now we'll not make any adjustments until I get to know him better.      Relevant Orders   Split night study   Essential hypertension (Chronic)    Blood pressure is well-controlled on accommodation of ACE inhibitor and HCTZ. Continue to monitor.      Excessive daytime sleepiness (Chronic)    Epworth score today 14 (see addendum). With his notable symptoms of daytime sleepiness, will refer for polysomnogram.  Relevant Orders   Split night study   Hyperlipidemia with target low density lipoprotein (LDL) cholesterol less than 70 mg/dL (Chronic)    Lipids look pretty well-controlled having just started atorvastatin. Will monitor for now.      Syncope, near (Chronic)    Not exactly sure what these episodes were. It sounds like he had some weird rhythm issues associated. If he were to have more symptoms of this, I would probably consider event monitor. He has noted some irregular heartbeat episodes of either fast or slow heart rates. For now we will simply monitor and see if these symptoms recur, but low threshold for considering event monitor.      Tobacco abuse    Long-standing smoking history. Not yet ready to quit. He has a family history of lung cancer in his father, therefore I agree with lung cancer screening. I spoke to him briefly about importance of smoking cessation. He says he is trying to cut back, but is still not there yet. He will need continued encouragement      Relevant Orders   Split night study      Current medicines are reviewed at length with the patient today. (+/- concerns) n/a The following changes have been made: n/a  Patient Instructions  SCHEDULE AT [place] Your physician has recommended that you have a sleep study. This test records several body functions during sleep, including: brain activity, eye movement, oxygen and  carbon dioxide blood levels, heart rate and rhythm, breathing rate and rhythm, the flow of air through your mouth and nose, snoring, body muscle movements, and chest and belly movement.    NO CHANGE WITH CURRENT MEDICATIONS     Your physician wants you to follow-up in [place]. You will receive a reminder letter in the mail two months in advance. If you don't receive a letter, please call our office to schedule the follow-up appointment.    Studies Ordered:   Orders Placed This Encounter  Procedures  . Split night study      Glenetta Hew, M.D., M.S. Interventional Cardiologist   Pager # [phone] Phone # [phone] [address] [place], [place] [address]  ADDENDUM:  Epworth Sleepiness Scale: Situation   Chance of Dozing/Sleeping (0 = never , 1 = slight chance , 2 = moderate chance , 3 = high chance )   sitting and reading 3    watching TV 3    sitting inactive in a public place 2    being a passenger in a motor vehicle for an hour or more 1    lying down in the afternoon 3    sitting and talking to someone 0    sitting quietly after lunch (no alcohol) 2    while stopped for a few minutes in traffic as the driver 0    Total Score  14; has hypertension. Is overweight/obese. Has insomnia and is losing sex drive. He is excessively sleepy during the day, and has dozed off/fall asleep at inappropriate times during the day. Wakes up feeling not rested.    Glenetta Hew, MD

## 2017-07-18 NOTE — Patient Instructions (Signed)
SCHEDULE AT [place] Your physician has recommended that you have a sleep study. This test records several body functions during sleep, including: brain activity, eye movement, oxygen and carbon dioxide blood levels, heart rate and rhythm, breathing rate and rhythm, the flow of air through your mouth and nose, snoring, body muscle movements, and chest and belly movement.    NO CHANGE WITH CURRENT MEDICATIONS     Your physician wants you to follow-up in [place]. You will receive a reminder letter in the mail two months in advance. If you don't receive a letter, please call our office to schedule the follow-up appointment.

## 2017-07-20 ENCOUNTER — Encounter: Payer: Self-pay | Admitting: Cardiology

## 2017-07-20 NOTE — Assessment & Plan Note (Signed)
Epworth score today 14 (see addendum). With his notable symptoms of daytime sleepiness, will refer for polysomnogram.

## 2017-07-20 NOTE — Assessment & Plan Note (Signed)
Long-standing smoking history. Not yet ready to quit. He has a family history of lung cancer in his father, therefore I agree with lung cancer screening. I spoke to him briefly about importance of smoking cessation. He says he is trying to cut back, but is still not there yet. He will need continued encouragement

## 2017-07-20 NOTE — Assessment & Plan Note (Signed)
Not exactly sure what these episodes were. It sounds like he had some weird rhythm issues associated. If he were to have more symptoms of this, I would probably consider event monitor. He has noted some irregular heartbeat episodes of either fast or slow heart rates. For now we will simply monitor and see if these symptoms recur, but low threshold for considering event monitor.

## 2017-07-20 NOTE — Assessment & Plan Note (Signed)
S/p PCI Om2 in Oct 2015 with known RCA CTO --> was essentially lost to follow-up and only on aspirin. Now started on statin by PCP. Thankfully, he has been astigmatic since 2015. Partly because of his concerns with slow heart rates prior to his near syncopal spells, I'm reluctant to use a beta blocker. He is on ACE inhibitor.  For now we'll not make any adjustments until I get to know him better.

## 2017-07-20 NOTE — Assessment & Plan Note (Signed)
Blood pressure is well-controlled on accommodation of ACE inhibitor and HCTZ. Continue to monitor.

## 2017-07-20 NOTE — Assessment & Plan Note (Signed)
Lipids look pretty well-controlled having just started atorvastatin. Will monitor for now.

## 2017-07-24 ENCOUNTER — Other Ambulatory Visit: Payer: Self-pay | Admitting: Primary Care

## 2017-07-24 ENCOUNTER — Encounter: Payer: Self-pay | Admitting: Primary Care

## 2017-07-24 DIAGNOSIS — N529 Male erectile dysfunction, unspecified: Secondary | ICD-10-CM

## 2017-07-26 ENCOUNTER — Encounter: Payer: Self-pay | Admitting: Primary Care

## 2017-07-30 ENCOUNTER — Ambulatory Visit (INDEPENDENT_AMBULATORY_CARE_PROVIDER_SITE_OTHER): Payer: BLUE CROSS/BLUE SHIELD

## 2017-07-30 DIAGNOSIS — Z23 Encounter for immunization: Secondary | ICD-10-CM | POA: Diagnosis not present

## 2017-08-22 ENCOUNTER — Other Ambulatory Visit: Payer: Self-pay | Admitting: *Deleted

## 2017-08-22 ENCOUNTER — Telehealth: Payer: Self-pay | Admitting: *Deleted

## 2017-08-22 DIAGNOSIS — G4719 Other hypersomnia: Secondary | ICD-10-CM

## 2017-08-22 DIAGNOSIS — I25119 Atherosclerotic heart disease of native coronary artery with unspecified angina pectoris: Secondary | ICD-10-CM

## 2017-08-22 DIAGNOSIS — I10 Essential (primary) hypertension: Secondary | ICD-10-CM

## 2017-08-22 NOTE — Telephone Encounter (Signed)
Order placed for HST-scheduled for 11/30 at 11:45 AM.   Called patient-left detailed message (ok per DPR) to make aware in lab study has been denied and canceled.   Advised to call back to confirm or with further questions or concerns.

## 2017-08-22 NOTE — Telephone Encounter (Signed)
-----   Message from Almyra Free sent at 08/22/2017  1:47 PM EST ----- Regarding: FW: In lab sleep study denied [name],  This needs to be changed to a home study. Thanks. ----- Message ----- From: Leonie Man, MD Sent: 08/19/2017   7:11 PM To: Amy Markus Jarvis Subject: RE: In lab sleep study denied                  OK  DH ----- Message ----- From: Almyra Free Sent: 08/19/2017   3:41 PM To: Leonie Man, MD Subject: In lab sleep study denied                      Pt did not meet in lab criteria. Is it okay to switch to home study? Thanks, Amy

## 2017-08-24 ENCOUNTER — Encounter: Payer: Self-pay | Admitting: Primary Care

## 2017-08-24 DIAGNOSIS — N529 Male erectile dysfunction, unspecified: Secondary | ICD-10-CM

## 2017-08-25 MED ORDER — SILDENAFIL CITRATE 20 MG PO TABS: ORAL_TABLET | ORAL | 2 refills | Status: DC

## 2017-08-26 ENCOUNTER — Encounter: Payer: Self-pay | Admitting: Primary Care

## 2017-08-26 DIAGNOSIS — I10 Essential (primary) hypertension: Secondary | ICD-10-CM

## 2017-08-26 NOTE — Telephone Encounter (Signed)
Received refill request and it have not been prescribed by Miguel Cochran. Last seen on 04/04/2017

## 2017-08-27 ENCOUNTER — Telehealth: Payer: Self-pay | Admitting: Primary Care

## 2017-08-27 ENCOUNTER — Ambulatory Visit: Payer: Self-pay

## 2017-08-27 NOTE — Telephone Encounter (Signed)
Ok with me 

## 2017-08-27 NOTE — Telephone Encounter (Signed)
Fine with me, thanks. 

## 2017-08-27 NOTE — Telephone Encounter (Signed)
This encounter was created in error - please disregard.

## 2017-08-27 NOTE — Telephone Encounter (Signed)
Patient is requesting transfer from [place] to [place].  Please advise.

## 2017-08-27 NOTE — Telephone Encounter (Signed)
Spoke to pt and advised his last OV was 03/2017 and he will need an OV for and meds. Advised pt to contact office back after viewing schedule or he may go to urgent care

## 2017-08-27 NOTE — Telephone Encounter (Signed)
   Reason for Disposition . [1] Sinus pain (not just congestion) AND [2] fever  Answer Assessment - Initial Assessment Questions 1. LOCATION: "Where does it hurt?"      Face and behind eyes 2. ONSET: "When did the sinus pain start?"  (e.g., hours, days)      Started 2 weeks ago but has gotten worse over the past 5 days 3. SEVERITY: "How bad is the pain?"   (Scale 1-10; mild, moderate or severe)   - MILD (1-3): doesn't interfere with normal activities    - MODERATE (4-7): interferes with normal activities (e.g., work or school) or awakens from sleep   - SEVERE (8-10): excruciating pain and patient unable to do any normal activities        moderate 4. RECURRENT SYMPTOM: "Have you ever had sinus problems before?" If so, ask: "When was the last time?" and "What happened that time?"      Yes - in August 5. NASAL CONGESTION: "Is the nose blocked?" If so, ask, "Can you open it or must you breathe through the mouth?"     Nose is blocked 6. NASAL DISCHARGE: "Do you have discharge from your nose?" If so ask, "What color?"     Yes - clear with blood; yellow 7. FEVER: "Do you have a fever?" If so, ask: "What is it, how was it measured, and when did it start?"      Unsure - chills 8. OTHER SYMPTOMS: "Do you have any other symptoms?" (e.g., sore throat, cough, earache, difficulty breathing)     Cough, sore throat, sore from coughing 9. PREGNANCY: "Is there any chance you are pregnant?" "When was your last menstrual period?"     No  Protocols used: SINUS PAIN OR CONGESTION-A-AH  Tried Nasacort, zyrtec, tried warm mist. Symptoms are interfering with sleep.Unable to get off work for an office visit this week.

## 2017-08-28 MED ORDER — LISINOPRIL-HYDROCHLOROTHIAZIDE 20-25 MG PO TABS: 1.0000 | ORAL_TABLET | Freq: Every day | ORAL | 0 refills | Status: DC

## 2017-08-29 ENCOUNTER — Encounter: Payer: Self-pay | Admitting: Internal Medicine

## 2017-08-29 ENCOUNTER — Ambulatory Visit: Payer: BLUE CROSS/BLUE SHIELD | Admitting: Family Medicine

## 2017-08-29 ENCOUNTER — Ambulatory Visit (INDEPENDENT_AMBULATORY_CARE_PROVIDER_SITE_OTHER): Payer: BLUE CROSS/BLUE SHIELD | Admitting: Internal Medicine

## 2017-08-29 ENCOUNTER — Encounter (HOSPITAL_BASED_OUTPATIENT_CLINIC_OR_DEPARTMENT_OTHER): Payer: BLUE CROSS/BLUE SHIELD

## 2017-08-29 DIAGNOSIS — J0111 Acute recurrent frontal sinusitis: Secondary | ICD-10-CM | POA: Diagnosis not present

## 2017-08-29 MED ORDER — AMOXICILLIN-POT CLAVULANATE 875-125 MG PO TABS
1.0000 | ORAL_TABLET | Freq: Two times a day (BID) | ORAL | 0 refills | Status: DC
Start: 2017-08-29 — End: 2018-11-09

## 2017-08-29 MED ORDER — HYDROCODONE-HOMATROPINE 5-1.5 MG/5ML PO SYRP: 5.0000 mL | ORAL_SOLUTION | Freq: Three times a day (TID) | ORAL | 0 refills | Status: DC | PRN

## 2017-08-29 NOTE — Telephone Encounter (Signed)
Attempt to call patient, no answer, left detailed message (ok per DPR) to verify patient is aware in lab study has been cancelled and rescheduled for a HST 11/30.     Advised to call with questions or concerns.

## 2017-08-29 NOTE — Progress Notes (Signed)
   Subjective:    Patient ID: Miguel Cochran, male    DOB: [birth year], 51 y.o.   MRN: [id number]  HPI The patient is a 51 YO male coming in for sinus problems. Started about 2-3 weeks ago. Fevers and chills. Yellow sputum and severe cough. Keeping him from sleeping and this is making him feel a lot worse. Major nose congestion and headaches and ear pain. He is taking zyrtec and nasacort.  Review of Systems  Constitutional: Positive for activity change, appetite change, chills, fatigue and fever. Negative for unexpected weight change.  HENT: Positive for congestion, ear discharge, ear pain, sinus pressure, sinus pain and sore throat. Negative for sneezing, tinnitus, trouble swallowing and voice change.   Eyes: Negative.   Respiratory: Positive for cough and shortness of breath. Negative for chest tightness and wheezing.   Cardiovascular: Negative.  Negative for chest pain, palpitations and leg swelling.  Gastrointestinal: Negative.  Negative for abdominal distention, abdominal pain, constipation, diarrhea, nausea and vomiting.  Skin: Negative.       Objective:   Physical Exam  Constitutional: He is oriented to person, place, and time. He appears well-developed and well-nourished.  HENT:  Head: Normocephalic and atraumatic.  Oropharynx with redness and clear drainage, nose with swollen turbinates, TMs normal bilaterally, frontal sinus tenderness  Eyes: EOM are normal.  Neck: Normal range of motion. No thyromegaly present.  Cardiovascular: Normal rate and regular rhythm.  Pulmonary/Chest: Effort normal and breath sounds normal. No respiratory distress. He has no wheezes. He has no rales.  Abdominal: Soft.  Musculoskeletal: He exhibits tenderness.  Lymphadenopathy:    He has cervical adenopathy.  Neurological: He is alert and oriented to person, place, and time.  Skin: Skin is warm and dry.   Vitals:   08/29/17 0820  BP: 122/88  Pulse: 79  Temp: 97.9 F (36.6 C)  TempSrc: Oral    SpO2: 99%  Weight: 257 lb (116.6 kg)  Height: 5\' 11"  (1.803 m)      Assessment & Plan:

## 2017-08-29 NOTE — Patient Instructions (Signed)
We have sent in the antibiotic called augmentin that you take 1 pill twice a day with food.   We have given you a cough medicine to use at night time. It can make you drowsy so do not operate a vehicle after taking.

## 2017-08-29 NOTE — Assessment & Plan Note (Signed)
Rx for augmentin and hydrocodone cough syrup. [place] narcotic database without opioid fills except cough syrup in august.

## 2017-09-02 NOTE — Telephone Encounter (Signed)
Left patient vm to call back to schedule transfer appt.

## 2017-09-12 ENCOUNTER — Encounter (HOSPITAL_BASED_OUTPATIENT_CLINIC_OR_DEPARTMENT_OTHER): Payer: BLUE CROSS/BLUE SHIELD

## 2017-09-14 ENCOUNTER — Encounter: Payer: Self-pay | Admitting: Internal Medicine

## 2017-09-15 NOTE — Telephone Encounter (Signed)
Marcelene [place], I agree with you, needs to be seen. Also, he called in last month wanting to switch from me to Dr. Jenny Reichmann.

## 2017-10-01 ENCOUNTER — Encounter: Payer: Self-pay | Admitting: Primary Care

## 2017-10-01 DIAGNOSIS — I10 Essential (primary) hypertension: Secondary | ICD-10-CM

## 2017-10-01 MED ORDER — LISINOPRIL-HYDROCHLOROTHIAZIDE 20-25 MG PO TABS: 1.0000 | ORAL_TABLET | Freq: Every day | ORAL | 0 refills | Status: DC

## 2017-10-24 ENCOUNTER — Ambulatory Visit: Payer: Self-pay | Admitting: Internal Medicine

## 2017-11-02 ENCOUNTER — Emergency Department (HOSPITAL_COMMUNITY)
Admission: EM | Admit: 2017-11-02 | Discharge: 2017-11-02 | Disposition: A | Discharge status: Home / Self Care | Payer: No Typology Code available for payment source | Attending: Emergency Medicine | Admitting: Emergency Medicine

## 2017-11-02 ENCOUNTER — Encounter (HOSPITAL_COMMUNITY): Payer: Self-pay | Admitting: Emergency Medicine

## 2017-11-02 ENCOUNTER — Emergency Department (HOSPITAL_COMMUNITY): Payer: No Typology Code available for payment source

## 2017-11-02 ENCOUNTER — Other Ambulatory Visit: Payer: Self-pay

## 2017-11-02 DIAGNOSIS — I10 Essential (primary) hypertension: Secondary | ICD-10-CM | POA: Diagnosis not present

## 2017-11-02 DIAGNOSIS — Z79899 Other long term (current) drug therapy: Secondary | ICD-10-CM | POA: Diagnosis not present

## 2017-11-02 DIAGNOSIS — J111 Influenza due to unidentified influenza virus with other respiratory manifestations: Secondary | ICD-10-CM | POA: Insufficient documentation

## 2017-11-02 DIAGNOSIS — F329 Major depressive disorder, single episode, unspecified: Secondary | ICD-10-CM | POA: Insufficient documentation

## 2017-11-02 DIAGNOSIS — R69 Illness, unspecified: Secondary | ICD-10-CM

## 2017-11-02 DIAGNOSIS — I2510 Atherosclerotic heart disease of native coronary artery without angina pectoris: Secondary | ICD-10-CM | POA: Diagnosis not present

## 2017-11-02 DIAGNOSIS — F17200 Nicotine dependence, unspecified, uncomplicated: Secondary | ICD-10-CM | POA: Diagnosis not present

## 2017-11-02 DIAGNOSIS — R509 Fever, unspecified: Secondary | ICD-10-CM | POA: Diagnosis present

## 2017-11-02 LAB — BASIC METABOLIC PANEL
Anion gap: 10 (ref 5–15)
BUN: 13 mg/dL (ref 6–20)
CO2: 21 mmol/L — ABNORMAL LOW (ref 22–32)
Calcium: 8.6 mg/dL — ABNORMAL LOW (ref 8.9–10.3)
Chloride: 103 mmol/L (ref 101–111)
Creatinine, Ser: 0.84 mg/dL (ref 0.61–1.24)
GFR calc Af Amer: >60 mL/min (ref >60)
Glucose, Bld: 142 mg/dL — ABNORMAL HIGH (ref 65–99)
POTASSIUM: 3.2 mmol/L — AB (ref 3.5–5.1)
SODIUM: 134 mmol/L — AB (ref 135–145)

## 2017-11-02 LAB — CBC WITH DIFFERENTIAL/PLATELET
BASOS ABS: 0.1 10*3/uL (ref 0.0–0.1)
Basophils Relative: 1 %
EOS ABS: 0.2 10*3/uL (ref 0.0–0.7)
Eosinophils Relative: 2 %
HCT: 44.4 % (ref 39.0–52.0)
Hemoglobin: 15.6 g/dL (ref 13.0–17.0)
Lymphocytes Relative: 34 %
Lymphs Abs: 2.8 10*3/uL (ref 0.7–4.0)
MCH: 32.5 pg (ref 26.0–34.0)
MCHC: 35.1 g/dL (ref 30.0–36.0)
MCV: 92.5 fL (ref 78.0–100.0)
MONO ABS: 0.5 10*3/uL (ref 0.1–1.0)
Monocytes Relative: 6 %
NEUTROS PCT: 57 %
Neutro Abs: 4.6 10*3/uL (ref 1.7–7.7)
PLATELETS: 186 10*3/uL (ref 150–400)
RBC: 4.8 MIL/uL (ref 4.22–5.81)
RDW: 12.1 % (ref 11.5–15.5)
WBC: 8.2 10*3/uL (ref 4.0–10.5)

## 2017-11-02 MED ORDER — ALBUTEROL SULFATE HFA 108 (90 BASE) MCG/ACT IN AERS
2.0000 | INHALATION_SPRAY | RESPIRATORY_TRACT | Status: DC | PRN
Start: 2017-11-02 — End: 2017-11-03
  Administered 2017-11-02: 2 via RESPIRATORY_TRACT
  Filled 2017-11-02: qty 6.7

## 2017-11-02 MED ORDER — ALBUTEROL SULFATE (2.5 MG/3ML) 0.083% IN NEBU
5.0000 mg | INHALATION_SOLUTION | Freq: Once | RESPIRATORY_TRACT | Status: AC
  Administered 2017-11-02: 5 mg via RESPIRATORY_TRACT
  Filled 2017-11-02: qty 6

## 2017-11-02 MED ORDER — PREDNISONE 50 MG PO TABS
60.0000 mg | ORAL_TABLET | Freq: Once | ORAL | Status: AC
  Administered 2017-11-02: 60 mg via ORAL
  Filled 2017-11-02: qty 1

## 2017-11-02 MED ORDER — SODIUM CHLORIDE 0.9 % IV BOLUS (SEPSIS)
1000.0000 mL | Freq: Once | INTRAVENOUS | Status: AC
  Administered 2017-11-02: 1000 mL via INTRAVENOUS

## 2017-11-02 MED ORDER — IBUPROFEN 800 MG PO TABS
800.0000 mg | ORAL_TABLET | Freq: Once | ORAL | Status: AC
  Administered 2017-11-02: 800 mg via ORAL
  Filled 2017-11-02: qty 1

## 2017-11-02 MED ORDER — ONDANSETRON 8 MG PO TBDP: 8.0000 mg | ORAL_TABLET | Freq: Three times a day (TID) | ORAL | 0 refills | Status: DC | PRN

## 2017-11-02 NOTE — ED Provider Notes (Signed)
[facility] EMERGENCY DEPARTMENT Provider Note   CSN: [id number] Arrival date & time: 11/02/17  2110     History   Chief Complaint Chief Complaint  Patient presents with  . Cough    HPI Miguel Cochran is a 52 y.o. male.  HPI  52 year old male history of coronary artery disease, hypertension, hyperlipidemia, smoker presents today with 1 week of Lenze-like illness.  He states symptoms began 1 week ago but worsened on Thursday.  He has been running a fever since Thursday.  He has had nasal congestion, sore throat, cough, nausea, and vomiting, and diarrhea.  States his fianc has similar symptoms.  He has had decreased p.o. intake but is able to tolerate fluids.  He is a smoker and continues to smoke.  He feels somewhat dyspneic.  He has been taking over-the-counter cough and cold medications without relief. Past Medical History:  Diagnosis Date  . CAD S/P percutaneous coronary angioplasty 07/2014   [facility]: Abnormal Nuc --> Cath: pRCA 100% CTO (Med Rx), 90% OM2 --> PCI with 2 overlapping 2.5 x 8 DES (Promus & Xience)  . Depression   . Frequent headaches   . GERD (gastroesophageal reflux disease)   . Hypertension   . Stomach ulcer     Patient Active Problem List   Diagnosis Date Noted  . Excessive daytime sleepiness 07/18/2017  . Hyperlipidemia with target low density lipoprotein (LDL) cholesterol less than 70 mg/dL 07/18/2017  . Syncope, near 06/15/2017  . Acute sinus infection 05/29/2017  . Allergic rhinitis 05/29/2017  . Wheezing 05/29/2017  . GERD (gastroesophageal reflux disease)   . Depression   . Essential hypertension   . Erectile dysfunction 04/04/2017  . Skin lesion of face 04/04/2017  . Tobacco abuse 04/04/2017  . Coronary artery disease involving native coronary artery of native heart with angina pectoris ([place]) 07/05/2014    Past Surgical History:  Procedure Laterality Date  . BACK SURGERY  1993  . CARDIAC CATHETERIZATION  07/2014   [facility]: EF 55%. pRCA 100% CTO (Med Rx). OM2 90% -PCI   . CORONARY STENT INTERVENTION  07/2014   [facility]: OM2: Xience DES 2.5 x 8 & Promus DES 2.5 x 8 (? overlapping)  . MOUTH SURGERY    . NM MYOVIEW LTD  06/2014   [facility]:  EF 55%, diminished exercise capacity w/ Angina. Large size, moderate severity reversible perfusion defect involving the basal and mid inferolateral walls, basal and mid inferior walls and inferoapex consistent with ischemia. --> Consider Cardiac Cath (done 07/2014)       Home Medications    Prior to Admission medications   Medication Sig Start Date End Date Taking? Authorizing Provider  Aspirin-Salicylamide-Caffeine (BC HEADACHE POWDER PO) Take 1 packet by mouth 3 (three) times daily as needed.    Yes [provider]  atorvastatin (LIPITOR) 20 MG tablet Take 1 tablet (20 mg total) by mouth daily. 04/08/17  Yes Pleas Koch, NP  b complex vitamins tablet Take 1 tablet by mouth daily.   Yes [provider]  Dextromethorphan HBr (SUCRETS 4-HOUR COUGH DROPS MT) Use as directed 1 each in the mouth or throat daily as needed (for cough/cold).   Yes [provider]  guaiFENesin (MUCINEX) 600 MG 12 hr tablet Take 600 mg by mouth 2 (two) times daily as needed.   Yes [provider]  ibuprofen (ADVIL,MOTRIN) 200 MG tablet Take 1,200 mg by mouth once as needed.   Yes [provider]  lisinopril-hydrochlorothiazide Reita May)  20-25 MG tablet Take 1 tablet by mouth daily. 10/01/17  Yes Pleas Koch, NP  omeprazole (PRILOSEC) 20 MG capsule Take 20 mg by mouth daily.   Yes [provider]  sildenafil (REVATIO) 20 MG tablet TAKE 2-5 TABLETS BY MOUTH 30 MINUTES PRIOR TO INTERCOURSE 08/25/17  Yes Pleas Koch, NP  amoxicillin-clavulanate (AUGMENTIN) 875-125 MG tablet Take 1 tablet 2 (two) times daily by mouth. Patient not taking: Reported on 11/02/2017 08/29/17   Hoyt Koch, MD    HYDROcodone-homatropine [facility]) 5-1.5 MG/5ML syrup Take 5 mLs every 8 (eight) hours as needed by mouth for cough. Patient not taking: Reported on 11/02/2017 08/29/17   Hoyt Koch, MD    Family History Family History  Problem Relation Age of Onset  . Alcohol abuse Mother   . Hyperlipidemia Mother   . Hypertension Mother   . Alcohol abuse Father   . Lung cancer Father   . Hyperlipidemia Father   . Hypertension Maternal Grandmother   . Hypertension Maternal Grandfather     Social History Social History   Tobacco Use  . Smoking status: Current Every Day Smoker    Packs/day: 1.00  . Smokeless tobacco: Never Used  Substance Use Topics  . Alcohol use: No  . Drug use: No     Allergies   Patient has no known allergies.   Review of Systems Review of Systems  All other systems reviewed and are negative.    Physical Exam Updated Vital Signs BP (!) 149/83 (BP Location: Left Arm)   Pulse 99   Temp 100.2 F (37.9 C) (Oral)   Resp 19   Ht 1.803 m (5\' 11" )   Wt 108.9 kg (240 lb)   SpO2 100%   BMI 33.47 kg/m   Physical Exam  Constitutional: He is oriented to person, place, and time. He appears well-developed and well-nourished.  HENT:  Head: Normocephalic and atraumatic.  Right Ear: External ear normal.  Left Ear: External ear normal.  Nose: Nose normal.  Mouth/Throat: Oropharynx is clear and moist.  And pearly bilaterally  Eyes: EOM are normal. Pupils are equal, round, and reactive to light.  Neck: Normal range of motion.  Cardiovascular: Normal rate and regular rhythm.  Pulmonary/Chest: Effort normal.  Some scattered rhonchi with a few expiratory wheezes with good air movement and no rales noted  Abdominal: Soft. Bowel sounds are normal.  Musculoskeletal: Normal range of motion. He exhibits no edema.  Neurological: He is alert and oriented to person, place, and time.  Skin: Skin is warm and dry. Capillary refill takes less than 2 seconds.   Psychiatric: He has a normal mood and affect.  Nursing note and vitals reviewed.    ED Treatments / Results  Labs (all labs ordered are listed, but only abnormal results are displayed) Labs Reviewed  BASIC METABOLIC PANEL - Abnormal; Notable for the following components:      Result Value   Sodium 134 (*)    Potassium 3.2 (*)    CO2 21 (*)    Glucose, Bld 142 (*)    Calcium 8.6 (*)    All other components within normal limits  CBC WITH DIFFERENTIAL/PLATELET    EKG  EKG Interpretation None       Radiology Dg Chest 2 View  Result Date: 11/02/2017 CLINICAL DATA:  Cough and fever EXAM: CHEST  2 VIEW COMPARISON:  06/15/2017 FINDINGS: Mild diffuse bronchitic changes. No acute consolidation or effusion. Normal heart size. No pneumothorax. IMPRESSION: No active  cardiopulmonary disease.  Mild bronchitic changes Electronically Signed   By: Donavan Foil M.D.   On: 11/02/2017 22:36    Procedures Procedures (including critical care time)  Medications Ordered in ED Medications  sodium chloride 0.9 % bolus 1,000 mL (1,000 mLs Intravenous New Bag/Given 11/02/17 2208)  albuterol (PROVENTIL) (2.5 MG/3ML) 0.083% nebulizer solution 5 mg (5 mg Nebulization Given 11/02/17 2228)  predniSONE (DELTASONE) tablet 60 mg (60 mg Oral Given 11/02/17 2159)  ibuprofen (ADVIL,MOTRIN) tablet 800 mg (800 mg Oral Given 11/02/17 2159)     Initial Impression / Assessment and Plan / ED Course  I have reviewed the triage vital signs and the nursing notes.  Pertinent labs & imaging results that were available during my care of the patient were reviewed by me and considered in my medical decision making (see chart for details).     52 year old man presents today with influenza-like illness.  Chest x-Alizae Bechtel here is clear.  He had some rhonchi and wheezes which cleared with one nebulizer treatment.  He is given prednisone here.  Had symptoms for a week and Tamiflu is unlikely to benefit him.  No flu swab was  done.  Patient is advised regarding stopping smoking, pushing p.o. fluids, and need for follow-up.  Discussed return precautions and he voices understanding.  Final Clinical Impressions(s) / ED Diagnoses   Final diagnoses:  Influenza-like illness    ED Discharge Orders    None       Pattricia Boss, MD 11/02/17 2252

## 2017-11-02 NOTE — Discharge Instructions (Signed)
Your symptoms are consistent with a viral infection.  Drink plenty of fluids and use zofran as prescribed for nausea. Stop smoking and use inhaler up to 2 puffs every four hours for cough. Return if you are worse. Recheck with your doctor this week.

## 2017-11-02 NOTE — ED Notes (Signed)
RT notified for neb tx. 

## 2017-11-02 NOTE — ED Notes (Signed)
EDP at bedside  

## 2017-11-02 NOTE — ED Triage Notes (Addendum)
Cough, nasal and chest congestion. N/v/d, vomited x 2 day, has had 6 episodes of diarrhea today. S/s since Thursday.  Pt took x6 ibuprofen x 2 hours ago .  Also RT ear pain.

## 2017-11-21 ENCOUNTER — Ambulatory Visit: Payer: Self-pay | Admitting: Internal Medicine

## 2018-03-26 ENCOUNTER — Ambulatory Visit: Payer: No Typology Code available for payment source | Admitting: Cardiology

## 2018-04-10 ENCOUNTER — Encounter: Payer: BLUE CROSS/BLUE SHIELD | Admitting: Internal Medicine

## 2018-06-06 ENCOUNTER — Other Ambulatory Visit: Payer: Self-pay | Admitting: Primary Care

## 2018-06-06 DIAGNOSIS — E785 Hyperlipidemia, unspecified: Secondary | ICD-10-CM

## 2018-11-09 ENCOUNTER — Encounter: Payer: Self-pay | Admitting: Internal Medicine

## 2018-11-09 ENCOUNTER — Ambulatory Visit (INDEPENDENT_AMBULATORY_CARE_PROVIDER_SITE_OTHER): Payer: BLUE CROSS/BLUE SHIELD | Admitting: Internal Medicine

## 2018-11-09 ENCOUNTER — Other Ambulatory Visit (INDEPENDENT_AMBULATORY_CARE_PROVIDER_SITE_OTHER): Payer: BLUE CROSS/BLUE SHIELD

## 2018-11-09 VITALS — BP 122/86 | HR 96 | Temp 98.2°F | Ht 71.0 in | Wt 261.0 lb

## 2018-11-09 DIAGNOSIS — J329 Chronic sinusitis, unspecified: Secondary | ICD-10-CM

## 2018-11-09 DIAGNOSIS — Z5320 Procedure and treatment not carried out because of patient's decision for unspecified reasons: Secondary | ICD-10-CM

## 2018-11-09 DIAGNOSIS — Z114 Encounter for screening for human immunodeficiency virus [HIV]: Secondary | ICD-10-CM | POA: Diagnosis not present

## 2018-11-09 DIAGNOSIS — E785 Hyperlipidemia, unspecified: Secondary | ICD-10-CM | POA: Diagnosis not present

## 2018-11-09 DIAGNOSIS — R220 Localized swelling, mass and lump, head: Secondary | ICD-10-CM

## 2018-11-09 DIAGNOSIS — Z125 Encounter for screening for malignant neoplasm of prostate: Secondary | ICD-10-CM | POA: Diagnosis not present

## 2018-11-09 DIAGNOSIS — Z0001 Encounter for general adult medical examination with abnormal findings: Secondary | ICD-10-CM

## 2018-11-09 DIAGNOSIS — H3530 Unspecified macular degeneration: Secondary | ICD-10-CM

## 2018-11-09 DIAGNOSIS — R739 Hyperglycemia, unspecified: Secondary | ICD-10-CM

## 2018-11-09 DIAGNOSIS — I10 Essential (primary) hypertension: Secondary | ICD-10-CM

## 2018-11-09 DIAGNOSIS — C4491 Basal cell carcinoma of skin, unspecified: Secondary | ICD-10-CM | POA: Insufficient documentation

## 2018-11-09 DIAGNOSIS — E119 Type 2 diabetes mellitus without complications: Secondary | ICD-10-CM | POA: Insufficient documentation

## 2018-11-09 DIAGNOSIS — C44310 Basal cell carcinoma of skin of unspecified parts of face: Secondary | ICD-10-CM

## 2018-11-09 LAB — TSH: TSH: 1.84 u[IU]/mL (ref 0.35–4.50)

## 2018-11-09 LAB — LIPID PANEL
Cholesterol: 179 mg/dL (ref 0–200)
HDL: 32.3 mg/dL — ABNORMAL LOW (ref >39.00)
NonHDL: 146.5
Total CHOL/HDL Ratio: 6
Triglycerides: 210 mg/dL — ABNORMAL HIGH (ref 0.0–149.0)
VLDL: 42 mg/dL — AB (ref 0.0–40.0)

## 2018-11-09 LAB — CBC WITH DIFFERENTIAL/PLATELET
BASOS ABS: 0.1 10*3/uL (ref 0.0–0.1)
Basophils Relative: 0.7 % (ref 0.0–3.0)
EOS ABS: 0.2 10*3/uL (ref 0.0–0.7)
Eosinophils Relative: 2.1 % (ref 0.0–5.0)
HCT: 49.5 % (ref 39.0–52.0)
HEMOGLOBIN: 17.7 g/dL — AB (ref 13.0–17.0)
LYMPHS ABS: 2.8 10*3/uL (ref 0.7–4.0)
Lymphocytes Relative: 26.1 % (ref 12.0–46.0)
MCHC: 35.8 g/dL (ref 30.0–36.0)
MCV: 94.5 fl (ref 78.0–100.0)
MONO ABS: 0.8 10*3/uL (ref 0.1–1.0)
Monocytes Relative: 7.9 % (ref 3.0–12.0)
NEUTROS PCT: 63.2 % (ref 43.0–77.0)
Neutro Abs: 6.7 10*3/uL (ref 1.4–7.7)
Platelets: 221 10*3/uL (ref 150.0–400.0)
RBC: 5.24 Mil/uL (ref 4.22–5.81)
RDW: 12.7 % (ref 11.5–15.5)
WBC: 10.7 10*3/uL — AB (ref 4.0–10.5)

## 2018-11-09 LAB — URINALYSIS, ROUTINE W REFLEX MICROSCOPIC
Bilirubin Urine: NEGATIVE
HGB URINE DIPSTICK: NEGATIVE
KETONES UR: NEGATIVE
Leukocytes, UA: NEGATIVE
NITRITE: NEGATIVE
RBC / HPF: NONE SEEN (ref 0–?)
SPECIFIC GRAVITY, URINE: 1.01 (ref 1.000–1.030)
Total Protein, Urine: NEGATIVE
UROBILINOGEN UA: 1 (ref 0.0–1.0)
Urine Glucose: NEGATIVE
pH: 7 (ref 5.0–8.0)

## 2018-11-09 LAB — BASIC METABOLIC PANEL
BUN: 12 mg/dL (ref 6–23)
CALCIUM: 9.5 mg/dL (ref 8.4–10.5)
CO2: 22 mEq/L (ref 19–32)
Chloride: 101 mEq/L (ref 96–112)
Creatinine, Ser: 0.78 mg/dL (ref 0.40–1.50)
GFR: 105.3 mL/min (ref >60.00)
GLUCOSE: 96 mg/dL (ref 70–99)
Potassium: 3.8 mEq/L (ref 3.5–5.1)
Sodium: 137 mEq/L (ref 135–145)

## 2018-11-09 LAB — HEMOGLOBIN A1C: HEMOGLOBIN A1C: 6.9 % — AB (ref 4.6–6.5)

## 2018-11-09 LAB — PSA: PSA: 0.2 ng/mL (ref 0.10–4.00)

## 2018-11-09 LAB — HEPATIC FUNCTION PANEL
ALT: 113 U/L — AB (ref 0–53)
AST: 52 U/L — ABNORMAL HIGH (ref 0–37)
Albumin: 4.2 g/dL (ref 3.5–5.2)
Alkaline Phosphatase: 84 U/L (ref 39–117)
BILIRUBIN DIRECT: 0.1 mg/dL (ref 0.0–0.3)
BILIRUBIN TOTAL: 0.5 mg/dL (ref 0.2–1.2)
Total Protein: 7.7 g/dL (ref 6.0–8.3)

## 2018-11-09 MED ORDER — ATORVASTATIN CALCIUM 20 MG PO TABS: 20.0000 mg | ORAL_TABLET | Freq: Every day | ORAL | 3 refills | Status: DC

## 2018-11-09 MED ORDER — TADALAFIL 20 MG PO TABS: 20.0000 mg | ORAL_TABLET | Freq: Every day | ORAL | 11 refills | Status: DC | PRN

## 2018-11-09 MED ORDER — LISINOPRIL-HYDROCHLOROTHIAZIDE 20-25 MG PO TABS: 1.0000 | ORAL_TABLET | Freq: Every day | ORAL | 3 refills | Status: DC

## 2018-11-09 NOTE — Assessment & Plan Note (Signed)
Right preauricular, for dermatology referral

## 2018-11-09 NOTE — Assessment & Plan Note (Signed)
[place] for retinal specialist referral,  to f/u any worsening symptoms or concerns

## 2018-11-09 NOTE — Assessment & Plan Note (Signed)
stable overall by history and exam, recent data reviewed with pt, and pt to continue medical treatment as before,  to f/u any worsening symptoms or concerns  

## 2018-11-09 NOTE — Progress Notes (Signed)
Subjective:    Patient ID: Miguel NELLES, male    DOB: [birth year], 53 y.o.   MRN: [id number]  HPI  Here for wellness and f/u;  Overall doing ok;  Pt denies Chest pain, worsening SOB, DOE, wheezing, orthopnea, PND, worsening LE edema, palpitations, dizziness or syncope.  Pt denies neurological change such as new headache, facial or extremity weakness.  Pt denies polydipsia, polyuria, or low sugar symptoms. Pt states overall good compliance with treatment and medications, good tolerability, and has been trying to follow appropriate diet.  Pt denies worsening depressive symptoms, suicidal ideation or panic. No fever, night sweats, wt loss, loss of appetite, or other constitutional symptoms.  Pt states good ability with ADL's, has low fall risk, home safety reviewed and adequate, no other significant changes in hearing or vision, and only occasionally active with exercise.  Due for colonoscopy Also has right preauricular basal cell ca for removal soon.  Does have several wks ongoing nasal allergy symptoms with clearish congestion, itch and sneezing, without fever, pain, swelling or wheezing, but overwhelming recent congestion without fever with cough, and marked difficulty with drainage, with having to tilt the whole head toward the right or left shoulders to get it to drain.  Has some daytime somnolence, but was not willing to do Home Sleep study previously due to cost.  Has insurance now, but wants to deal with sinus issue first.  Also has a retinal lesion left eye with suggestion by optho that he may have early onset left macular degeneration, and asks for retinal specialist  Past Medical History:  Diagnosis Date  . CAD S/P percutaneous coronary angioplasty 07/2014   [facility]: Abnormal Nuc --> Cath: pRCA 100% CTO (Med Rx), 90% OM2 --> PCI with 2 overlapping 2.5 x 8 DES (Promus & Xience)  . Depression   . Frequent headaches   . GERD (gastroesophageal reflux disease)   . Hypertension   .  Stomach ulcer    Past Surgical History:  Procedure Laterality Date  . BACK SURGERY  1993  . CARDIAC CATHETERIZATION  07/2014   [facility]: EF 55%. pRCA 100% CTO (Med Rx). OM2 90% -PCI   . CORONARY STENT INTERVENTION  07/2014   [facility]: OM2: Xience DES 2.5 x 8 & Promus DES 2.5 x 8 (? overlapping)  . MOUTH SURGERY    . NM MYOVIEW LTD  06/2014   [facility]:  EF 55%, diminished exercise capacity w/ Angina. Large size, moderate severity reversible perfusion defect involving the basal and mid inferolateral walls, basal and mid inferior walls and inferoapex consistent with ischemia. --> Consider Cardiac Cath (done 07/2014)    reports that he has been smoking. He has been smoking about 1.00 pack per day. He has never used smokeless tobacco. He reports that he does not drink alcohol or use drugs. family history includes Alcohol abuse in his father and mother; Hyperlipidemia in his father and mother; Hypertension in his maternal grandfather, maternal grandmother, and mother; Lung cancer in his father. No Known Allergies Current Outpatient Medications on File Prior to Visit  Medication Sig Dispense Refill  . Aspirin-Salicylamide-Caffeine (BC HEADACHE POWDER PO) Take 1 packet by mouth 3 (three) times daily as needed.     Marland Kitchen b complex vitamins tablet Take 1 tablet by mouth daily.    Marland Kitchen ibuprofen (ADVIL,MOTRIN) 200 MG tablet Take 1,200 mg by mouth once as needed.    Marland Kitchen omeprazole (PRILOSEC) 20 MG capsule Take 20 mg by mouth daily.  No current facility-administered medications on file prior to visit.    Review of Systems Constitutional: Negative for other unusual diaphoresis, sweats, appetite or weight changes HENT: Negative for other worsening hearing loss, ear pain, facial swelling, mouth sores or neck stiffness.   Eyes: Negative for other worsening pain, redness or other visual disturbance.  Respiratory: Negative for other stridor or swelling Cardiovascular: Negative for other palpitations  or other chest pain  Gastrointestinal: Negative for worsening diarrhea or loose stools, blood in stool, distention or other pain Genitourinary: Negative for hematuria, flank pain or other change in urine volume.  Musculoskeletal: Negative for myalgias or other joint swelling.  Skin: Negative for other color change, or other wound or worsening drainage.  Neurological: Negative for other syncope or numbness. Hematological: Negative for other adenopathy or swelling Psychiatric/Behavioral: Negative for hallucinations, other worsening agitation, SI, self-injury, or new decreased concentration All other system neg per pt    Objective:   Physical Exam BP 122/86   Pulse 96   Temp 98.2 F (36.8 C) (Oral)   Ht 5\' 11"  (1.803 m)   Wt 261 lb (118.4 kg)   SpO2 98%   BMI 36.40 kg/m  VS noted,  Constitutional: Pt is oriented to person, place, and time. Appears well-developed and well-nourished, in no significant distress and comfortable Head: Normocephalic and atraumatic  Eyes: Conjunctivae and EOM are normal. Pupils are equal, round, and reactive to light Right Ear: External ear normal without discharge Left Ear: External ear normal without discharge Nose: Nose without discharge or deformity Mouth/Throat: Oropharynx is without other ulcerations and moist  Neck: Normal range of motion. Neck supple. No JVD present. No tracheal deviation present or significant neck LA or mass Cardiovascular: Normal rate, regular rhythm, normal heart sounds and intact distal pulses.   Pulmonary/Chest: WOB normal and breath sounds without rales or wheezing  Abdominal: Soft. Bowel sounds are normal. NT. No HSM  Musculoskeletal: Normal range of motion. Exhibits no edema Lymphadenopathy: Has no other cervical adenopathy.  Neurological: Pt is alert and oriented to person, place, and time. Pt has normal reflexes. No cranial nerve deficit. Motor grossly intact, Gait intact Skin: Skin is warm and dry. No rash noted or new  ulcerations Psychiatric:  Has normal mood and affect. Behavior is normal without agitation No other exam findings Lab Results  Component Value Date   WBC 8.2 11/02/2017   HGB 15.6 11/02/2017   HCT 44.4 11/02/2017   PLT 186 11/02/2017   GLUCOSE 142 (H) 11/02/2017   CHOL 138 07/11/2017   TRIG 160.0 (H) 07/11/2017   HDL 34.10 (L) 07/11/2017   LDLCALC 72 07/11/2017   ALT 46 07/11/2017   AST 27 07/11/2017   NA 134 (L) 11/02/2017   K 3.2 (L) 11/02/2017   CL 103 11/02/2017   CREATININE 0.84 11/02/2017   BUN 13 11/02/2017   CO2 21 (L) 11/02/2017          Assessment & Plan:

## 2018-11-09 NOTE — Assessment & Plan Note (Signed)

## 2018-11-09 NOTE — Assessment & Plan Note (Addendum)
Uncontrolled despite multiple OTC meds such as zyrtec and flonase, for CT sinus, ENT referral, to f/u any worsening symptoms or concerns including daytime somnolence that he defers pulm referral for now to r/o osa  In addition to the time spent performing CPE, I spent an additional 25 minutes face to face,in which greater than 50% of this time was spent in counseling and coordination of care for patient's acute illness as documented, including the differential dx, treatment, further evaluation and other management of chronic sinusitis, HLD, hyperglycemia, HTN, macular degeneration and basal cell cancer of face

## 2018-11-09 NOTE — Patient Instructions (Signed)
Please continue all other medications as before, and refills have been done if requested.  Please have the pharmacy call with any other refills you may need.  Please continue your efforts at being more active, low cholesterol diet, and weight control.  You are otherwise up to date with prevention measures today.  Please keep your appointments with your specialists as you may have planned  You will be contacted regarding the referral for: Dermatology, ophthalmology, ENT and colonoscopy  Please go to the LAB in the Basement (turn left off the elevator) for the tests to be done today  You will be contacted by phone if any changes need to be made immediately.  Otherwise, you will receive a letter about your results with an explanation, but please check with MyChart first.  Please remember to sign up for MyChart if you have not done so, as this will be important to you in the future with finding out test results, communicating by private email, and scheduling acute appointments online when needed.  Please return in 1 year for your yearly visit, or sooner if needed, with Lab testing done 3-5 days before

## 2018-11-10 ENCOUNTER — Other Ambulatory Visit: Payer: Self-pay | Admitting: Internal Medicine

## 2018-11-10 DIAGNOSIS — R7989 Other specified abnormal findings of blood chemistry: Secondary | ICD-10-CM

## 2018-11-10 DIAGNOSIS — R945 Abnormal results of liver function studies: Secondary | ICD-10-CM

## 2018-11-10 LAB — LDL CHOLESTEROL, DIRECT: Direct LDL: 121 mg/dL

## 2018-11-10 LAB — HIV ANTIBODY (ROUTINE TESTING W REFLEX): HIV: NONREACTIVE

## 2018-11-10 MED ORDER — ATORVASTATIN CALCIUM 40 MG PO TABS: 40.0000 mg | ORAL_TABLET | Freq: Every day | ORAL | 3 refills | Status: DC

## 2018-11-11 ENCOUNTER — Telehealth: Payer: Self-pay

## 2018-11-11 NOTE — Telephone Encounter (Signed)
Pt has viewed results via MyChart  

## 2018-11-11 NOTE — Telephone Encounter (Signed)
-----   Message from Biagio Borg, MD sent at 11/10/2018  8:29 PM EST ----- Left message on MyChart, pt to cont same tx except  The test results show that your current treatment is OK, except that there is mild elevation of the liver tests, the sugar is mildly overall elevated (the A1c is >6.5), and the LDL cholesterol is moderately elevated as well.  We should check the liver and gallbladder with an ultrasound of the abdomen, and increase the lipitor to 40 mg per day.  The A1c is 6.9 and does not require more treatment for now except to follow a diabetic type diet.  I will do the order and prescription, and you should hear from the office as well.  Barnaby Rippeon to please inform pt, I will do order and rx

## 2018-11-12 ENCOUNTER — Encounter: Payer: Self-pay | Admitting: Internal Medicine

## 2018-11-13 ENCOUNTER — Encounter: Payer: Self-pay | Admitting: Internal Medicine

## 2018-11-17 ENCOUNTER — Encounter: Payer: Self-pay | Admitting: Gastroenterology

## 2018-11-18 ENCOUNTER — Other Ambulatory Visit: Payer: BLUE CROSS/BLUE SHIELD

## 2018-11-19 ENCOUNTER — Encounter: Payer: Self-pay | Admitting: Internal Medicine

## 2018-11-20 ENCOUNTER — Ambulatory Visit
Admission: RE | Admit: 2018-11-20 | Discharge: 2018-11-20 | Disposition: A | Discharge status: Home / Self Care | Payer: BLUE CROSS/BLUE SHIELD | Source: Ambulatory Visit | Attending: Internal Medicine | Admitting: Internal Medicine

## 2018-11-20 DIAGNOSIS — R945 Abnormal results of liver function studies: Secondary | ICD-10-CM

## 2018-11-20 DIAGNOSIS — R220 Localized swelling, mass and lump, head: Secondary | ICD-10-CM

## 2018-11-20 DIAGNOSIS — R7989 Other specified abnormal findings of blood chemistry: Secondary | ICD-10-CM

## 2018-11-20 DIAGNOSIS — J329 Chronic sinusitis, unspecified: Secondary | ICD-10-CM | POA: Diagnosis not present

## 2018-11-20 DIAGNOSIS — K760 Fatty (change of) liver, not elsewhere classified: Secondary | ICD-10-CM | POA: Diagnosis not present

## 2018-11-20 MED ORDER — IOPAMIDOL (ISOVUE-300) INJECTION 61%
75.0000 mL | Freq: Once | INTRAVENOUS | Status: AC | PRN
  Administered 2018-11-20: 75 mL via INTRAVENOUS

## 2018-11-23 ENCOUNTER — Telehealth: Payer: Self-pay

## 2018-11-23 NOTE — Telephone Encounter (Signed)
-----   Message from Biagio Borg, MD sent at 11/22/2018  3:27 PM EST ----- Left message on MyChart, pt to cont same tx except  The test results show that your current treatment is OK, as the ultrasound did not show any specific acute abnormality.  There was a "spot" to the liver, however, for which the etiology is not clear.  We need to have you return in 6 months to recheck this.  I will ask my staff to call you to help arrange this.    Shirron to please inform pt, and assist with appt to f/u at 6 mo

## 2018-11-23 NOTE — Telephone Encounter (Signed)
Pt has viewed results via MyChart  

## 2018-11-30 DIAGNOSIS — J320 Chronic maxillary sinusitis: Secondary | ICD-10-CM | POA: Diagnosis not present

## 2018-11-30 DIAGNOSIS — J322 Chronic ethmoidal sinusitis: Secondary | ICD-10-CM | POA: Diagnosis not present

## 2018-11-30 DIAGNOSIS — J040 Acute laryngitis: Secondary | ICD-10-CM | POA: Diagnosis not present

## 2018-11-30 DIAGNOSIS — K210 Gastro-esophageal reflux disease with esophagitis: Secondary | ICD-10-CM | POA: Diagnosis not present

## 2018-12-14 DIAGNOSIS — J301 Allergic rhinitis due to pollen: Secondary | ICD-10-CM | POA: Diagnosis not present

## 2018-12-14 DIAGNOSIS — J342 Deviated nasal septum: Secondary | ICD-10-CM | POA: Diagnosis not present

## 2018-12-14 DIAGNOSIS — G4733 Obstructive sleep apnea (adult) (pediatric): Secondary | ICD-10-CM | POA: Diagnosis not present

## 2018-12-14 DIAGNOSIS — J3089 Other allergic rhinitis: Secondary | ICD-10-CM | POA: Diagnosis not present

## 2018-12-17 ENCOUNTER — Encounter: Payer: Self-pay | Admitting: Internal Medicine

## 2018-12-28 ENCOUNTER — Other Ambulatory Visit: Payer: Self-pay

## 2018-12-28 ENCOUNTER — Encounter: Payer: Self-pay | Admitting: Gastroenterology

## 2018-12-28 ENCOUNTER — Telehealth: Payer: Self-pay

## 2018-12-28 ENCOUNTER — Encounter: Payer: Self-pay | Admitting: Internal Medicine

## 2018-12-28 ENCOUNTER — Ambulatory Visit (AMBULATORY_SURGERY_CENTER): Payer: Self-pay | Admitting: *Deleted

## 2018-12-28 VITALS — Temp 98.4°F | Ht 72.0 in | Wt 266.0 lb

## 2018-12-28 DIAGNOSIS — Z1211 Encounter for screening for malignant neoplasm of colon: Secondary | ICD-10-CM

## 2018-12-28 DIAGNOSIS — L989 Disorder of the skin and subcutaneous tissue, unspecified: Secondary | ICD-10-CM

## 2018-12-28 MED ORDER — NA SULFATE-K SULFATE-MG SULF 17.5-3.13-1.6 GM/177ML PO SOLN: ORAL | 0 refills | Status: DC

## 2018-12-28 NOTE — Telephone Encounter (Signed)
Dr. Jenny Cochran I do not see where this referral has been place. Please advise.   Miguel Cochran- Can this be done as soon as the referral is placed? Thanks!    Copied from [place] [id number]. Topic: Referral - Question >> Dec 28, 2018  9:00 AM Miguel Cochran wrote: Reason for CRM: Patient was supposef to be referred to skin/cancer specialist for growth on right sode of face. Was told the referral would be placed around 11/20/2018 but has not heard anything. Please call patient to notify is this has been completed. >> Dec 28, 2018  9:07 AM [place], Miguel Cochran wrote: Please advise.. Patient sent MyChart message as well.

## 2018-12-28 NOTE — Progress Notes (Signed)
Patient denies any allergies to eggs or soy. Patient denies any problems with anesthesia/sedation. Patient denies any oxygen use at home. Patient denies taking any diet/weight loss medications or blood thinners. EMMI education offered, pt decline. suprep coupon given to pt.

## 2018-12-28 NOTE — Telephone Encounter (Signed)
Pt is scheduled and aware

## 2019-01-05 ENCOUNTER — Ambulatory Visit: Payer: Self-pay | Admitting: Internal Medicine

## 2019-01-05 NOTE — Telephone Encounter (Signed)
Noted  

## 2019-01-05 NOTE — Telephone Encounter (Signed)
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  Exposed to the CO-VID 19 coronavirus yesterday morning (01/04/2019.   No symptoms.   See triage notes for full details.  Pt instructed to self quarantine for 14 days.   Check his temperature twice a day.   I went over all the precautions he should take.    I went over the coronavirus exposure protocol with him.   He verbalized understanding.   He did mention he needed to stop and buy a thermometer.    I have sent these notes to Dr. Jenny Reichmann high priority so he would be aware.     Reason for Disposition . [1] COVID-19 EXPOSURE (Close Contact) within last 14 days AND [2] NO cough, fever, or breathing difficulty  Answer Assessment - Initial Assessment Questions 1. 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   He received a box from [employer] at work.    He opened it up.   The breathalyzer is supposed to be sent encased and not removed from the equipment by the pt using it.   It's to be shipped in all together.     This pt that sent the breathalyzer piece back had removed it from the encasing.   The pt that sent it in said he took it out because he is positive for the coronavirus.   Mr. Towry was exposed to the equipment because he handled it and was wondering why the pt that had used the equipment had removed it from the encasing it was supposed to stay in.    Mr. Kavanagh then found a note in the bottoms of the box saying, " I removed this from the encasing since I am positive for the coronavirus". 2. PLACE of CONTACT: "Where were you when you were exposed to COVID-19  (coronavirus disease 2019)?" (e.g., city, state, country)     Work.   See above for full explanation.   3. TYPE of CONTACT: "How much contact was there?" (e.g., live in same house, work in same office, same school)     Was at work and opened a box with a  breathalyzer in it from a pt that was positive for the coronavirus.   4. DATE of CONTACT: "When did you have contact with a coronavirus patient?" (e.g., days)     Yesterday (01/04/2019 morning at work. 5. DURATION of CONTACT: "How long were you in contact with the COVID-19 (coronavirus disease) patient?" (e.g., a few seconds, passed by person, a few minutes, live with the patient)     A few minutes while handling the contents of the box. 6. SYMPTOMS: "Do you have any symptoms?" (e.g., fever, cough, breathing difficulty)     No  (cough used to access the coronavirus protocol) 7. PREGNANCY OR POSTPARTUM: "Is there any chance you are pregnant?" "When was your last menstrual period?" "Did you deliver in the last 2 weeks?"     N/A 8. HIGH RISK: "Do you have any heart or lung problems? Do you have a weakened immune system?" (e.g., CHF, COPD, asthma, HIV positive, chemotherapy, renal failure, diabetes mellitus, sickle cell anemia)     Not asked.  Protocols used: CORONAVIRUS (COVID-19) EXPOSURE-A-AH

## 2019-01-11 ENCOUNTER — Encounter: Payer: BLUE CROSS/BLUE SHIELD | Admitting: Gastroenterology

## 2019-01-18 DIAGNOSIS — C44319 Basal cell carcinoma of skin of other parts of face: Secondary | ICD-10-CM | POA: Diagnosis not present

## 2019-02-15 DIAGNOSIS — M7751 Other enthesopathy of right foot: Secondary | ICD-10-CM | POA: Diagnosis not present

## 2019-02-15 DIAGNOSIS — D179 Benign lipomatous neoplasm, unspecified: Secondary | ICD-10-CM | POA: Diagnosis not present

## 2019-02-15 DIAGNOSIS — M19071 Primary osteoarthritis, right ankle and foot: Secondary | ICD-10-CM | POA: Diagnosis not present

## 2019-02-22 DIAGNOSIS — M7751 Other enthesopathy of right foot: Secondary | ICD-10-CM | POA: Diagnosis not present

## 2019-02-22 DIAGNOSIS — D179 Benign lipomatous neoplasm, unspecified: Secondary | ICD-10-CM | POA: Diagnosis not present

## 2019-03-09 DIAGNOSIS — D179 Benign lipomatous neoplasm, unspecified: Secondary | ICD-10-CM | POA: Diagnosis not present

## 2019-03-09 DIAGNOSIS — M7751 Other enthesopathy of right foot: Secondary | ICD-10-CM | POA: Diagnosis not present

## 2019-03-10 ENCOUNTER — Telehealth: Payer: BLUE CROSS/BLUE SHIELD | Admitting: Family

## 2019-03-10 DIAGNOSIS — J069 Acute upper respiratory infection, unspecified: Secondary | ICD-10-CM

## 2019-03-10 MED ORDER — FLUTICASONE PROPIONATE 50 MCG/ACT NA SUSP: 2.0000 | Freq: Every day | NASAL | 6 refills | Status: AC

## 2019-03-10 NOTE — Progress Notes (Signed)
We are sorry you are not feeling well.  Here is how we plan to help!  Based on what you have shared with me, it looks like you may have a viral upper respiratory infection.  Upper respiratory infections are caused by a large number of viruses; however, rhinovirus is the most common cause.   Approximately 5 minutes was spent documenting and reviewing patient's chart.   Symptoms vary from person to person, with common symptoms including sore throat, cough, and fatigue or lack of energy.  A low-grade fever of up to 100.4 may present, but is often uncommon.  Symptoms vary however, and are closely related to a person's age or underlying illnesses.  The most common symptoms associated with an upper respiratory infection are nasal discharge or congestion, cough, sneezing, headache and pressure in the ears and face.  These symptoms usually persist for about 3 to 10 days, but can last up to 2 weeks.  It is important to know that upper respiratory infections do not cause serious illness or complications in most cases.    Upper respiratory infections can be transmitted from person to person, with the most common method of transmission being a person's hands.  The virus is able to live on the skin and can infect other persons for up to 2 hours after direct contact.  Also, these can be transmitted when someone coughs or sneezes; thus, it is important to cover the mouth to reduce this risk.  To keep the spread of the illness at [place], good hand hygiene is very important.  This is an infection that is most likely caused by a virus. There are no specific treatments other than to help you with the symptoms until the infection runs its course.  We are sorry you are not feeling well.  Here is how we plan to help!   For nasal congestion, you may use an oral decongestants such as Mucinex D or if you have glaucoma or high blood pressure use plain Mucinex.  Saline nasal spray or nasal drops can help and can safely be used as  often as needed for congestion.  For your congestion, I have prescribed Fluticasone nasal spray one spray in each nostril twice a day  If you do not have a history of heart disease, hypertension, diabetes or thyroid disease, prostate/bladder issues or glaucoma, you may also use Sudafed to treat nasal congestion.  It is highly recommended that you consult with a pharmacist or your primary care physician to ensure this medication is safe for you to take.     If you have a cough, you may use cough suppressants such as Delsym and Robitussin.  If you have glaucoma or high blood pressure, you can also use Coricidin HBP.     If you have a sore or scratchy throat, use a saltwater gargle-  to  teaspoon of salt dissolved in a 4-ounce to 8-ounce glass of warm water.  Gargle the solution for approximately 15-30 seconds and then spit.  It is important not to swallow the solution.  You can also use throat lozenges/cough drops and Chloraseptic spray to help with throat pain or discomfort.  Warm or cold liquids can also be helpful in relieving throat pain.  For headache, pain or general discomfort, you can use Ibuprofen or Tylenol as directed.   Some authorities believe that zinc sprays or the use of Echinacea may shorten the course of your symptoms.   HOME CARE . Only take medications as instructed by  your medical team. . Be sure to drink plenty of fluids. Water is fine as well as fruit juices, sodas and electrolyte beverages. You may want to stay away from caffeine or alcohol. If you are nauseated, try taking small sips of liquids. How do you know if you are getting enough fluid? Your urine should be a pale yellow or almost colorless. . Get rest. . Taking a steamy shower or using a humidifier may help nasal congestion and ease sore throat pain. You can place a towel over your head and breathe in the steam from hot water coming from a faucet. . Using a saline nasal spray works much the same way. . Cough drops,  hard candies and sore throat lozenges may ease your cough. . Avoid close contacts especially the very young and the elderly . Cover your mouth if you cough or sneeze . Always remember to wash your hands.   GET HELP RIGHT AWAY IF: . You develop worsening fever. . If your symptoms do not improve within 10 days . You develop yellow or green discharge from your nose over 3 days. . You have coughing fits . You develop a severe head ache or visual changes. . You develop shortness of breath, difficulty breathing or start having chest pain . Your symptoms persist after you have completed your treatment plan  MAKE SURE YOU   Understand these instructions.  Will watch your condition.  Will get help right away if you are not doing well or get worse.  Your e-visit answers were reviewed by a board certified advanced clinical practitioner to complete your personal care plan. Depending upon the condition, your plan could have included both over the counter or prescription medications. Please review your pharmacy choice. If there is a problem, you may call our nursing hot line at and have the prescription routed to another pharmacy. Your safety is important to [place]. If you have drug allergies check your prescription carefully.   You can use MyChart to ask questions about today's visit, request a non-urgent call back, or ask for a work or school excuse for 24 hours related to this e-Visit. If it has been greater than 24 hours you will need to follow up with your provider, or enter a new e-Visit to address those concerns. You will get an e-mail in the next two days asking about your experience.  I hope that your e-visit has been valuable and will speed your recovery. Thank you for using e-visits.

## 2019-03-12 ENCOUNTER — Encounter: Payer: Self-pay | Admitting: Gastroenterology

## 2019-03-15 ENCOUNTER — Ambulatory Visit: Payer: BLUE CROSS/BLUE SHIELD | Admitting: Internal Medicine

## 2019-03-22 ENCOUNTER — Ambulatory Visit: Payer: BLUE CROSS/BLUE SHIELD | Admitting: Podiatry

## 2019-03-22 ENCOUNTER — Other Ambulatory Visit: Payer: Self-pay

## 2019-03-22 VITALS — Temp 97.9°F

## 2019-03-22 DIAGNOSIS — M79674 Pain in right toe(s): Secondary | ICD-10-CM | POA: Diagnosis not present

## 2019-03-22 DIAGNOSIS — R600 Localized edema: Secondary | ICD-10-CM

## 2019-03-23 ENCOUNTER — Telehealth: Payer: Self-pay | Admitting: *Deleted

## 2019-03-23 DIAGNOSIS — R600 Localized edema: Secondary | ICD-10-CM

## 2019-03-23 DIAGNOSIS — M79674 Pain in right toe(s): Secondary | ICD-10-CM

## 2019-03-23 NOTE — Telephone Encounter (Signed)
-----   Message from Edrick Kins, DPM sent at 03/22/2019  3:08 PM EDT ----- Regarding: MRI RT forefoot Please order MRI RT forefoot.   Dx: Chronic pain right great toe distal phalanx x1 year.  Previously diagnosed with lipoma right great toe.  Thanks, Dr. Amalia Hailey

## 2019-03-23 NOTE — Telephone Encounter (Signed)
Orders to J. Quintana, RN for pre-cert, faxed to [facility]. 

## 2019-03-29 DIAGNOSIS — L820 Inflamed seborrheic keratosis: Secondary | ICD-10-CM | POA: Diagnosis not present

## 2019-03-29 NOTE — Progress Notes (Signed)
   HPI: 53 year old male presenting today as a new patient, referred by Dr. Gershon Mussel, with a chief complaint of a lipoma to the right hallux that has been present for the past year. He states he has had three injections for treatment but it always recurs. His last injection was one week ago. Walking increases the pain. Patient is here for further evaluation and treatment.   Past Medical History:  Diagnosis Date  . CAD S/P percutaneous coronary angioplasty 07/2014   [facility]: Abnormal Nuc --> Cath: pRCA 100% CTO (Med Rx), 90% OM2 --> PCI with 2 overlapping 2.5 x 8 DES (Promus & Xience)  . Cancer ([place])    skin cancer  . Depression   . Frequent headaches   . GERD (gastroesophageal reflux disease)   . Hypertension   . Stomach ulcer      Physical Exam: General: The patient is alert and oriented x3 in no acute distress.  Dermatology: Skin is warm, dry and supple bilateral lower extremities. Negative for open lesions or macerations.  Vascular: Palpable pedal pulses bilaterally. No erythema noted. Capillary refill within normal limits.  Neurological: Epicritic and protective threshold grossly intact bilaterally.   Musculoskeletal Exam: Edema with pain on palpation noted to the right great toe. Range of motion within normal limits to all pedal and ankle joints bilateral. Muscle strength 5/5 in all groups bilateral.   Assessment: 1. Chronic right great toe edema and pain   Plan of Care:  1. Patient evaluated. 2. Order for MRI of the right forefoot placed.  3. Return to clinic in 3 weeks to review MRI and possible surgical consult.      Edrick Kins, DPM [facility]  Dr. Edrick Kins, DPM    [address], [place] [address]                Office [phone]  Fax [phone]

## 2019-03-31 ENCOUNTER — Ambulatory Visit: Payer: BC Managed Care – PPO | Admitting: *Deleted

## 2019-03-31 ENCOUNTER — Other Ambulatory Visit: Payer: Self-pay

## 2019-03-31 VITALS — Ht 71.0 in | Wt 260.0 lb

## 2019-03-31 DIAGNOSIS — Z1211 Encounter for screening for malignant neoplasm of colon: Secondary | ICD-10-CM

## 2019-03-31 NOTE — Progress Notes (Signed)
No egg or soy allergy known to patient  No issues with past sedation with any surgeries  or procedures, no intubation problems  No diet pills per patient No home 02 use per patient  No blood thinners per patient  Pt denies issues with constipation  No A fib or A flutter  EMMI video sent to pt's e mail   Pt verified name, DOB, address and insurance during PV today. Pt mailed instruction packet to included paper to complete and mail back to [facility] with addressed and stamped envelope, Emmi video, copy of consent form to read and not return, and instructions.  PV completed over the phone. Pt encouraged to call with questions or issues.  Patient had suprep already at home from previously scheduled procedure.

## 2019-04-01 ENCOUNTER — Encounter: Payer: Self-pay | Admitting: Gastroenterology

## 2019-04-10 ENCOUNTER — Other Ambulatory Visit: Payer: BLUE CROSS/BLUE SHIELD

## 2019-04-11 ENCOUNTER — Encounter: Payer: Self-pay | Admitting: Internal Medicine

## 2019-04-12 ENCOUNTER — Encounter (HOSPITAL_COMMUNITY): Payer: Self-pay | Admitting: Emergency Medicine

## 2019-04-12 ENCOUNTER — Other Ambulatory Visit: Payer: Self-pay

## 2019-04-12 ENCOUNTER — Emergency Department (HOSPITAL_COMMUNITY): Payer: BC Managed Care – PPO

## 2019-04-12 ENCOUNTER — Ambulatory Visit: Payer: Self-pay

## 2019-04-12 ENCOUNTER — Emergency Department (HOSPITAL_COMMUNITY)
Admission: EM | Admit: 2019-04-12 | Discharge: 2019-04-12 | Disposition: A | Discharge status: Home / Self Care | Payer: BC Managed Care – PPO | Attending: Emergency Medicine | Admitting: Emergency Medicine

## 2019-04-12 ENCOUNTER — Ambulatory Visit: Payer: BLUE CROSS/BLUE SHIELD | Admitting: Podiatry

## 2019-04-12 DIAGNOSIS — I10 Essential (primary) hypertension: Secondary | ICD-10-CM | POA: Diagnosis not present

## 2019-04-12 DIAGNOSIS — Z79899 Other long term (current) drug therapy: Secondary | ICD-10-CM | POA: Diagnosis not present

## 2019-04-12 DIAGNOSIS — F17210 Nicotine dependence, cigarettes, uncomplicated: Secondary | ICD-10-CM | POA: Insufficient documentation

## 2019-04-12 DIAGNOSIS — K130 Diseases of lips: Secondary | ICD-10-CM | POA: Diagnosis not present

## 2019-04-12 DIAGNOSIS — I2510 Atherosclerotic heart disease of native coronary artery without angina pectoris: Secondary | ICD-10-CM | POA: Diagnosis not present

## 2019-04-12 DIAGNOSIS — R918 Other nonspecific abnormal finding of lung field: Secondary | ICD-10-CM | POA: Diagnosis not present

## 2019-04-12 DIAGNOSIS — R739 Hyperglycemia, unspecified: Secondary | ICD-10-CM | POA: Insufficient documentation

## 2019-04-12 LAB — COMPREHENSIVE METABOLIC PANEL
ALT: 98 U/L — ABNORMAL HIGH (ref 0–44)
AST: 76 U/L — ABNORMAL HIGH (ref 15–41)
Albumin: 3.7 g/dL (ref 3.5–5.0)
Alkaline Phosphatase: 118 U/L (ref 38–126)
Anion gap: 15 (ref 5–15)
BUN: 11 mg/dL (ref 6–20)
CO2: 20 mmol/L — ABNORMAL LOW (ref 22–32)
Calcium: 9.3 mg/dL (ref 8.9–10.3)
Chloride: 97 mmol/L — ABNORMAL LOW (ref 98–111)
Creatinine, Ser: 0.91 mg/dL (ref 0.61–1.24)
GFR calc Af Amer: >60 mL/min (ref >60)
GFR calc non Af Amer: >60 mL/min (ref >60)
Glucose, Bld: 457 mg/dL — ABNORMAL HIGH (ref 70–99)
Potassium: 3.8 mmol/L (ref 3.5–5.1)
Sodium: 132 mmol/L — ABNORMAL LOW (ref 135–145)
Total Bilirubin: 0.5 mg/dL (ref 0.3–1.2)
Total Protein: 7.9 g/dL (ref 6.5–8.1)

## 2019-04-12 LAB — POCT I-STAT EG7
Bicarbonate: 23.7 mmol/L (ref 20.0–28.0)
Calcium, Ion: 1.07 mmol/L — ABNORMAL LOW (ref 1.15–1.40)
HCT: 51 % (ref 39.0–52.0)
Hemoglobin: 17.3 g/dL — ABNORMAL HIGH (ref 13.0–17.0)
O2 Saturation: 100 %
Potassium: 3.8 mmol/L (ref 3.5–5.1)
Sodium: 132 mmol/L — ABNORMAL LOW (ref 135–145)
TCO2: 25 mmol/L (ref 22–32)
pCO2, Ven: 34.1 mmHg — ABNORMAL LOW (ref 44.0–60.0)
pH, Ven: 7.451 — ABNORMAL HIGH (ref 7.250–7.430)
pO2, Ven: 180 mmHg — ABNORMAL HIGH (ref 32.0–45.0)

## 2019-04-12 LAB — CBC WITH DIFFERENTIAL/PLATELET
Abs Immature Granulocytes: 0.02 10*3/uL (ref 0.00–0.07)
Basophils Absolute: 0.1 10*3/uL (ref 0.0–0.1)
Basophils Relative: 1 %
Eosinophils Absolute: 0.1 10*3/uL (ref 0.0–0.5)
Eosinophils Relative: 2 %
HCT: 49.5 % (ref 39.0–52.0)
Hemoglobin: 17.7 g/dL — ABNORMAL HIGH (ref 13.0–17.0)
Immature Granulocytes: 0 %
Lymphocytes Relative: 32 %
Lymphs Abs: 2.1 10*3/uL (ref 0.7–4.0)
MCH: 32.8 pg (ref 26.0–34.0)
MCHC: 35.8 g/dL (ref 30.0–36.0)
MCV: 91.7 fL (ref 80.0–100.0)
Monocytes Absolute: 0.5 10*3/uL (ref 0.1–1.0)
Monocytes Relative: 7 %
Neutro Abs: 3.8 10*3/uL (ref 1.7–7.7)
Neutrophils Relative %: 58 %
Platelets: 167 10*3/uL (ref 150–400)
RBC: 5.4 MIL/uL (ref 4.22–5.81)
RDW: 11.9 % (ref 11.5–15.5)
WBC: 6.5 10*3/uL (ref 4.0–10.5)
nRBC: 0 % (ref 0.0–0.2)

## 2019-04-12 LAB — URINALYSIS, ROUTINE W REFLEX MICROSCOPIC
Bacteria, UA: NONE SEEN
Bilirubin Urine: NEGATIVE
Glucose, UA: >=500 mg/dL — AB
Hgb urine dipstick: NEGATIVE
Ketones, ur: NEGATIVE mg/dL
Leukocytes,Ua: NEGATIVE
Nitrite: NEGATIVE
Protein, ur: NEGATIVE mg/dL
Specific Gravity, Urine: 1.032 — ABNORMAL HIGH (ref 1.005–1.030)
pH: 5 (ref 5.0–8.0)

## 2019-04-12 LAB — CBG MONITORING, ED
Glucose-Capillary: 498 mg/dL — ABNORMAL HIGH (ref 70–99)
Glucose-Capillary: 345 mg/dL — ABNORMAL HIGH (ref 70–99)
Glucose-Capillary: 307 mg/dL — ABNORMAL HIGH (ref 70–99)

## 2019-04-12 MED ORDER — METFORMIN HCL 500 MG PO TABS: 500.0000 mg | ORAL_TABLET | Freq: Two times a day (BID) | ORAL | 0 refills | Status: DC

## 2019-04-12 MED ORDER — METFORMIN HCL 500 MG PO TABS
500.0000 mg | ORAL_TABLET | Freq: Once | ORAL | Status: AC
  Administered 2019-04-12: 500 mg via ORAL
  Filled 2019-04-12: qty 1

## 2019-04-12 MED ORDER — SODIUM CHLORIDE 0.9 % IV BOLUS
1000.0000 mL | Freq: Once | INTRAVENOUS | Status: AC
  Administered 2019-04-12: 1000 mL via INTRAVENOUS

## 2019-04-12 MED ORDER — CLOTRIMAZOLE 1 % EX CREA: TOPICAL_CREAM | CUTANEOUS | 0 refills | Status: AC

## 2019-04-12 MED ORDER — SODIUM CHLORIDE 0.9 % IV BOLUS
1000.0000 mL | Freq: Once | INTRAVENOUS | Status: AC
  Administered 2019-04-12: 14:00:00 1000 mL via INTRAVENOUS

## 2019-04-12 NOTE — ED Notes (Signed)
Patient verbalizes understanding of discharge instructions. Opportunity for questioning and answers were provided. Armband removed by staff, pt discharged from ED by self in stable condition

## 2019-04-12 NOTE — Telephone Encounter (Signed)
rec'd call from pt. To report new onset of high blood sugars.  Reported he has had "insatiable" thirst and frequent urination, for approx. 2 weeks.  Also c/o intermittent dizziness and blurred vision.  Reported some shortness of breath with activity.  Denied shortness of breath at rest.  Reported on 6/28, he checked blood sugar with girlfriend's machine, and it read "above 600", at 12:00 PM;   rechecked at approx. 4:00 PM, and reading was 468.  This morning at 9:00 AM, blood sugar was 507.   Advised pt. To go to ER at this time, due to new onset of very high blood sugars.  Pt. Verb. Understanding.  Agreed with plan.       Reason for Disposition . Blood glucose > 500 mg/dL (27.8 mmol/L)  Answer Assessment - Initial Assessment Questions 1. BLOOD GLUCOSE: "What is your blood glucose level?"      507 @9 :00 AM today  2. ONSET: "When did you check the blood glucose?"    Noon 6/28 reading was above 600;  3. USUAL RANGE: "What is your glucose level usually?" (e.g., usual fasting morning value, usual evening value)     New onset 4. KETONES: "Do you check for ketones (urine or blood test strips)?" If yes, ask: "What does the test show now?"      No  5. TYPE 1 or 2:  "Do you know what type of diabetes you have?"  (e.g., Type 1, Type 2, Gestational; doesn't know)     Has not been diagnosed 6. INSULIN: "Do you take insulin?" "What type of insulin(s) do you use? What is the mode of delivery? (syringe, pen (e.g., injection or  pump)?"      no 7. DIABETES PILLS: "Do you take any pills for your diabetes?" If yes, ask: "Have you missed taking any pills recently?"     no 8. OTHER SYMPTOMS: "Do you have any symptoms?" (e.g., fever, frequent urination, difficulty breathing, dizziness, weakness, vomiting)     Freq. Urination, insatiable thirst, dizziness, some SOB with activity 9. PREGNANCY: "Is there any chance you are pregnant?" "When was your last menstrual period?"     N/a  Protocols used: DIABETES - HIGH  BLOOD SUGAR-A-AH

## 2019-04-12 NOTE — ED Triage Notes (Signed)
Pt arrives from home today. Pt sent by PCP for hyperglycemia. Pt states that over the past two weeks his blood sugar has been high. Pt states he has been thirsty, having blurred vision, and has been dizzy. Pt last blood sugar at home was 507.

## 2019-04-12 NOTE — Telephone Encounter (Signed)
Shirron to contact pt - due to severity of elevated sugar, needs to go to ED or UC now.  Please encourage

## 2019-04-12 NOTE — ED Notes (Signed)
Pt given crackers and water. Pt also given towel roll as a make shift pillow

## 2019-04-12 NOTE — ED Provider Notes (Addendum)
[facility] EMERGENCY DEPARTMENT Provider Note   CSN: [id number] Arrival date & time: 04/12/19  1002    History   Chief Complaint Chief Complaint  Patient presents with  . Hyperglycemia    HPI STRYKER Miguel Cochran is a 53 y.o. male with history of CAD, GERD, HLD, HTN, seasonal allergies presenting for evaluation of acute onset, progressively worsening fatigue, polydipsia, polyuria for 2 weeks.  He reports feeling excessively thirsty and urinating more.  Also notes some weight loss and that his pants are fitting more loosely.  Endorses fatigue, nausea, and lightheadedness but denies syncope, fever, headaches, chest pain, cough, vomiting, or abdominal pain.  He does report some dyspnea on exertion for the last month or so.  Yesterday he used his girlfriend's glucometer to check his blood sugars and the first 1 read greater than 600, and subsequent ones were in the 400s and 500s.  He is a current smoker of approximately a pack of cigarettes daily, occasionally smokes marijuana, denies any other recreational drug use or any alcohol intake.  Per chart review, patient was prediabetic on blood work that was performed on 11/09/2018 with a hemoglobin A1c of 6.9%.  He is also complaining of some fissures around the corners of his lips bilaterally that has been present for the last couple of weeks as well.  He has been applying Vaseline without relief.  He denies any drainage from that area.     The history is provided by the patient.    Past Medical History:  Diagnosis Date  . Allergy    SEASONAL  . CAD S/P percutaneous coronary angioplasty 07/2014   [facility]: Abnormal Nuc --> Cath: pRCA 100% CTO (Med Rx), 90% OM2 --> PCI with 2 overlapping 2.5 x 8 DES (Promus & Xience)  . Cancer ([place])    skin cancer  . Depression   . Frequent headaches   . GERD (gastroesophageal reflux disease)   . Hyperlipidemia   . Hypertension   . Stomach ulcer     Patient Active Problem List    Diagnosis Date Noted  . Chronic sinusitis 11/09/2018  . Encounter for well adult exam with abnormal findings 11/09/2018  . Basal cell carcinoma 11/09/2018  . Macular degeneration of left eye 11/09/2018  . Hyperglycemia 11/09/2018  . Excessive daytime sleepiness 07/18/2017  . Hyperlipidemia with target low density lipoprotein (LDL) cholesterol less than 70 mg/dL 07/18/2017  . Syncope, near 06/15/2017  . Allergic rhinitis 05/29/2017  . GERD (gastroesophageal reflux disease)   . Depression   . Essential hypertension   . Erectile dysfunction 04/04/2017  . Tobacco abuse 04/04/2017  . Coronary artery disease involving native coronary artery of native heart with angina pectoris ([place]) 07/05/2014    Past Surgical History:  Procedure Laterality Date  . BACK SURGERY  1993  . CARDIAC CATHETERIZATION  07/2014   [facility]: EF 55%. pRCA 100% CTO (Med Rx). OM2 90% -PCI   . CORONARY STENT INTERVENTION  07/2014   [facility]: OM2: Xience DES 2.5 x 8 & Promus DES 2.5 x 8 (? overlapping)  . MOUTH SURGERY    . NM MYOVIEW LTD  06/2014   [facility]:  EF 55%, diminished exercise capacity w/ Angina. Large size, moderate severity reversible perfusion defect involving the basal and mid inferolateral walls, basal and mid inferior walls and inferoapex consistent with ischemia. --> Consider Cardiac Cath (done 07/2014)        Home Medications    Prior to Admission medications  Medication Sig Start Date End Date Taking? Authorizing Provider  Aspirin-Salicylamide-Caffeine (BC HEADACHE POWDER PO) Take 1 packet by mouth 3 (three) times daily as needed (pain).    Yes [provider]  atorvastatin (LIPITOR) 40 MG tablet Take 1 tablet (40 mg total) by mouth daily. 11/10/18  Yes Biagio Borg, MD  b complex vitamins tablet Take 1 tablet by mouth daily.   Yes [provider]  fluticasone (FLONASE) 50 MCG/ACT nasal spray Place 2 sprays into both nostrils daily. 03/10/19  Yes Hawks, Christy A,  FNP  ibuprofen (ADVIL,MOTRIN) 200 MG tablet Take 800 mg by mouth every 8 (eight) hours as needed for mild pain.    Yes [provider]  lisinopril-hydrochlorothiazide (PRINZIDE,ZESTORETIC) 20-25 MG tablet Take 1 tablet by mouth daily. 11/09/18  Yes Biagio Borg, MD  loratadine-pseudoephedrine (CLARITIN-D 12-HOUR) 5-120 MG tablet Take 1 tablet by mouth 2 (two) times daily.   Yes [provider]  omeprazole (PRILOSEC) 20 MG capsule Take 20 mg by mouth daily.   Yes [provider]  tadalafil (CIALIS) 20 MG tablet Take 1 tablet (20 mg total) by mouth daily as needed for erectile dysfunction. 11/09/18  Yes Biagio Borg, MD  clotrimazole (LOTRIMIN) 1 % cream Apply to affected area 2 times daily 04/12/19   Rodell Perna A, PA-C  metFORMIN (GLUCOPHAGE) 500 MG tablet Take 1 tablet (500 mg total) by mouth 2 (two) times daily with a meal. 04/12/19   Renita Papa, PA-C    Family History Family History  Problem Relation Age of Onset  . Alcohol abuse Mother   . Hyperlipidemia Mother   . Hypertension Mother   . Alcohol abuse Father   . Lung cancer Father   . Hyperlipidemia Father   . Hypertension Maternal Grandmother   . Hypertension Maternal Grandfather   . Colon cancer Neg Hx   . Colon polyps Neg Hx   . Esophageal cancer Neg Hx   . Rectal cancer Neg Hx   . Stomach cancer Neg Hx     Social History Social History   Tobacco Use  . Smoking status: Current Every Day Smoker    Packs/day: 1.00    Types: Cigarettes  . Smokeless tobacco: Never Used  Substance Use Topics  . Alcohol use: No  . Drug use: No     Allergies   Patient has no known allergies.   Review of Systems Review of Systems  Constitutional: Positive for fatigue. Negative for chills and fever.  Respiratory: Positive for shortness of breath. Negative for cough.   Cardiovascular: Negative for chest pain and leg swelling.  Gastrointestinal: Positive for nausea. Negative for abdominal pain, diarrhea and  vomiting.  Endocrine: Positive for polydipsia and polyuria.  Neurological: Positive for light-headedness. Negative for syncope.  All other systems reviewed and are negative.    Physical Exam Updated Vital Signs BP 137/79 (BP Location: Right Arm)   Pulse 77   Temp 98.1 F (36.7 C)   Resp 18   Ht 5\' 11"  (1.803 m)   Wt 117.9 kg   SpO2 98%   BMI 36.26 kg/m   Physical Exam Vitals signs and nursing note reviewed.  Constitutional:      General: He is not in acute distress.    Appearance: He is well-developed.  HENT:     Head: Normocephalic and atraumatic.     Mouth/Throat:     Comments: Scaling and fissuring noted around the lateral commissures of the mouth bilaterally. No drainage.  Eyes:     General:        Right eye: No discharge.        Left eye: No discharge.     Conjunctiva/sclera: Conjunctivae normal.  Neck:     Musculoskeletal: Normal range of motion and neck supple.     Vascular: No JVD.     Trachea: No tracheal deviation.  Cardiovascular:     Rate and Rhythm: Normal rate and regular rhythm.  Pulmonary:     Effort: Pulmonary effort is normal.     Breath sounds: Normal breath sounds.  Abdominal:     General: Bowel sounds are normal. There is no distension.     Palpations: Abdomen is soft.     Tenderness: There is no abdominal tenderness. There is no guarding or rebound.  Skin:    General: Skin is warm and dry.     Findings: No erythema.  Neurological:     Mental Status: He is alert.  Psychiatric:        Behavior: Behavior normal.      ED Treatments / Results  Labs (all labs ordered are listed, but only abnormal results are displayed) Labs Reviewed  COMPREHENSIVE METABOLIC PANEL - Abnormal; Notable for the following components:      Result Value   Sodium 132 (*)    Chloride 97 (*)    CO2 20 (*)    Glucose, Bld 457 (*)    AST 76 (*)    ALT 98 (*)    All other components within normal limits  CBC WITH DIFFERENTIAL/PLATELET - Abnormal; Notable for  the following components:   Hemoglobin 17.7 (*)    All other components within normal limits  URINALYSIS, ROUTINE W REFLEX MICROSCOPIC - Abnormal; Notable for the following components:   Specific Gravity, Urine 1.032 (*)    Glucose, UA >=500 (*)    All other components within normal limits  CBG MONITORING, ED - Abnormal; Notable for the following components:   Glucose-Capillary 498 (*)    All other components within normal limits  POCT I-STAT EG7 - Abnormal; Notable for the following components:   pH, Ven 7.451 (*)    pCO2, Ven 34.1 (*)    pO2, Ven 180.0 (*)    Sodium 132 (*)    Calcium, Ion 1.07 (*)    Hemoglobin 17.3 (*)    All other components within normal limits  CBG MONITORING, ED - Abnormal; Notable for the following components:   Glucose-Capillary 345 (*)    All other components within normal limits  CBG MONITORING, ED - Abnormal; Notable for the following components:   Glucose-Capillary 307 (*)    All other components within normal limits    EKG   Radiology No results found.  Procedures Procedures (including critical care time)  Medications Ordered in ED Medications  sodium chloride 0.9 % bolus 1,000 mL (0 mLs Intravenous Stopped 04/12/19 1559)  sodium chloride 0.9 % bolus 1,000 mL (0 mLs Intravenous Stopped 04/12/19 1559)  metFORMIN (GLUCOPHAGE) tablet 500 mg (500 mg Oral Given 04/12/19 1330)     Initial Impression / Assessment and Plan / ED Course  I have reviewed the triage vital signs and the nursing notes.  Pertinent labs & imaging results that were available during my care of the patient were reviewed by me and considered in my medical decision making (see chart for details).        Patient presenting for evaluation of acute hyperglycemia with associated polydipsia, polyuria, weight loss, fatigue,  and angular chelitis.  He is afebrile, vital signs are stable in the ED.  He is nontoxic in appearance.  He used his girlfriend's glucometer yesterday to  check his blood sugars and was found to have sugars over 600 yesterday.  His PCP sent him to the ED to rule out DKA.   Initial CBG in the ED is 498.  Will obtain blood work, give IV fluids, and reassess.  Lab work reviewed by me shows no leukocytosis, no anemia.  His glucose is elevated on CMP, bicarb mildly low at 20 but anion gap is within normal limits and he has no ketonuria on UA.  He is not acidotic on his VBG.  By definition, he is not in DKA.  He was given 2 L of normal saline and metformin in the ED with improvement in his blood sugar down to 307.  He is overall quite well-appearing.  No evidence of surrounding cellulitis around his angular chelitis lesions.  I spoke with the patient's PCP Dr. Jenny Reichmann on the telephone and we discussed the patient's work-up and findings.  He feels comfortable with discharge home and I agree as the patient is overall well-appearing, mentating well and appears well-hydrated.  He would like the patient to follow-up in his office on Monday, 1 week from his presentation to the ED. the patient likely has underlying undiagnosed type 2 diabetes as his last hemoglobin A1c in January was mildly elevated.  Angular chelitis can also be seen in type 2 diabetes mellitus.  I will start the patient on metformin, we discussed titrating up to avoid GI side effects.  We also discussed dietary and lifestyle modifications.  We will also discharge with Lotrimin cream prescription for his angular chelitis.  Discussed strict ED return precautions.  Patient verbalized understanding of and agreement with plan and patient stable for discharge home at this time.  Final Clinical Impressions(s) / ED Diagnoses   Final diagnoses:  Hyperglycemia  Angular cheilitis    ED Discharge Orders         Ordered    metFORMIN (GLUCOPHAGE) 500 MG tablet  2 times daily with meals     04/12/19 1539    clotrimazole (LOTRIMIN) 1 % cream     04/12/19 1539           Avree Szczygiel, [place] A, PA-C 04/15/19 1325     Renita Papa, PA-C 04/15/19 1540    Charlesetta Shanks, MD 04/19/19 1117

## 2019-04-12 NOTE — Discharge Instructions (Signed)
Start taking metformin twice daily.  This medication can cause some upset stomach side effects so slowly increase your dose.  I usually recommend taking 1/2 tablet twice daily for 3 to 4 days, then increasing to 1 full tablet in the morning and 1/2 tablet in the evening for several days, up to 1 full tablet twice daily.  I have attached information for diabetes eating plan.   Also apply clotrimazole ointment to the affected areas on the lips twice daily.  You can also apply Vaseline to help protect the area and avoid further irritation.   I spoke with your PCP today on the phone.  He would like for you to call the office tomorrow to set up a follow-up appointment for this coming Monday.  Return to the emergency department immediately for any concerning signs or symptoms develop such as persistent vomiting, altered mental status, fevers, chest pain, abdominal pain, or shortness of breath.

## 2019-04-13 ENCOUNTER — Telehealth: Payer: Self-pay | Admitting: Gastroenterology

## 2019-04-13 ENCOUNTER — Telehealth: Payer: Self-pay | Admitting: Internal Medicine

## 2019-04-13 NOTE — Telephone Encounter (Signed)
Copied from [place] [id number]. Topic: Appointment Scheduling - Scheduling Inquiry for Clinic >> Apr 12, 2019  4:50 PM Sharene Skeans wrote: Reason for CRM: Pt needs to schedule a hospital follow up and also needs to know if it is still ok for him to have his colonoscopy that is scheduled for Wednesday at 7am, due to his blood sugar issues/ please advise

## 2019-04-13 NOTE — Telephone Encounter (Signed)

## 2019-04-13 NOTE — Telephone Encounter (Signed)
OK for colonoscopy and even to take his new metformin that AM; his sugar will be likely monitored about the procedure as well

## 2019-04-13 NOTE — Telephone Encounter (Signed)
Called patient and left message for him to call back to schedule an appointment.  Can you advise regarding the colonoscopy?

## 2019-04-14 ENCOUNTER — Encounter: Payer: BLUE CROSS/BLUE SHIELD | Admitting: Gastroenterology

## 2019-04-14 NOTE — Telephone Encounter (Signed)
Called pt, LVM.   

## 2019-04-15 ENCOUNTER — Ambulatory Visit (INDEPENDENT_AMBULATORY_CARE_PROVIDER_SITE_OTHER): Payer: BC Managed Care – PPO | Admitting: Internal Medicine

## 2019-04-15 ENCOUNTER — Encounter: Payer: Self-pay | Admitting: Internal Medicine

## 2019-04-15 ENCOUNTER — Other Ambulatory Visit: Payer: Self-pay

## 2019-04-15 VITALS — BP 124/78 | HR 100 | Temp 98.3°F | Ht 71.0 in | Wt 250.0 lb

## 2019-04-15 DIAGNOSIS — E119 Type 2 diabetes mellitus without complications: Secondary | ICD-10-CM | POA: Diagnosis not present

## 2019-04-15 DIAGNOSIS — K219 Gastro-esophageal reflux disease without esophagitis: Secondary | ICD-10-CM | POA: Diagnosis not present

## 2019-04-15 DIAGNOSIS — E785 Hyperlipidemia, unspecified: Secondary | ICD-10-CM

## 2019-04-15 DIAGNOSIS — I10 Essential (primary) hypertension: Secondary | ICD-10-CM

## 2019-04-15 DIAGNOSIS — F419 Anxiety disorder, unspecified: Secondary | ICD-10-CM | POA: Diagnosis not present

## 2019-04-15 LAB — POCT GLYCOSYLATED HEMOGLOBIN (HGB A1C): Hemoglobin A1C: 12.3 % — AB (ref 4.0–5.6)

## 2019-04-15 MED ORDER — ONETOUCH VERIO W/DEVICE KIT: PACK | 0 refills | Status: AC

## 2019-04-15 MED ORDER — METFORMIN HCL 500 MG PO TABS: 500.0000 mg | ORAL_TABLET | Freq: Two times a day (BID) | ORAL | 3 refills | Status: AC

## 2019-04-15 MED ORDER — ONETOUCH VERIO VI STRP: ORAL_STRIP | 12 refills | Status: DC

## 2019-04-15 MED ORDER — LANCET DEVICE MISC: 11 refills | Status: DC

## 2019-04-15 MED ORDER — ALPRAZOLAM 1 MG PO TABS: ORAL_TABLET | ORAL | 0 refills | Status: DC

## 2019-04-15 MED ORDER — FAMOTIDINE 20 MG PO TABS: 20.0000 mg | ORAL_TABLET | Freq: Two times a day (BID) | ORAL | 11 refills | Status: AC | PRN

## 2019-04-15 NOTE — Patient Instructions (Signed)
Your A1c was 12.3 today  Please take all new medication as prescribed  - the xanax as needed, and pepcid instead of the [facility] powders  Please continue all other medications as before, including the metformin 500 mg twice per day  Please have the pharmacy call with any other refills you may need.  Please continue your efforts at being more active, low cholesterol diabetic diet, and weight control.  Please keep your appointments with your specialists as you may have planned  You will be contacted regarding the referral for: Diabetes Education  Your glucometer and supplies were sent to the pharmacy  Please check your sugars at least 3 times per day before meals, and leave a message in 5 days regarding the average sugar at each time of the day  Please return in 3 months, or sooner if needed, with Lab testing done 3-5 days before

## 2019-04-15 NOTE — Assessment & Plan Note (Signed)
Ok to change the [facility] powders to pepcid 20 bid prn,  to f/u any worsening symptoms or concerns

## 2019-04-15 NOTE — Assessment & Plan Note (Signed)
With situational worsening due to 8 wks laid off work, and current new DM with diet indiscretions; for hopefully short term xanax prn,  to f/u any worsening symptoms or concerns

## 2019-04-15 NOTE — Assessment & Plan Note (Signed)
stable overall by history and exam, recent data reviewed with pt, and pt to continue medical treatment as before,  to f/u any worsening symptoms or concerns  

## 2019-04-15 NOTE — Assessment & Plan Note (Addendum)
New onset severe likely exacerbated by dietary excess; has typical high sugar symptoms with polydipsia, polyuria, tachycardia, blurred vision and lost 10 lbs unintentionally; seen in ED with fluids, not ketotic and able to reduce to 300s; now on metformin 500 bid and tolerating ok except for "slight" diarrhea;  A1c today  - 12.3  Ok to cont metformin 500 bid, follow DM diet - refer DM education, for glucometer and supplies to be sent, to check sugars AC and HS if able for 5 days and let us know the averages; we may not be able to increase the metformin further due to diarrhea but may be ok to try higher dose if averages continue to be elevated; will plan to ROV 3 mo  Note:  Total time for pt hx, exam, review of record with pt in the room, determination of diagnoses and plan for further eval and tx is > 40 min, with over 50% spent in coordination and counseling of patient including the differential dx, tx, further evaluation and other management of new onset DM, HTN, GERD, anxiety

## 2019-04-15 NOTE — Progress Notes (Signed)
Subjective:    Patient ID: Miguel Cochran, male    DOB: 05-25-1966, 53 y.o.   MRN: [id number]  HPI  Here to f/u new onset DM with c/o polydipsia, polyuria, tachycardia, blurred vision and recent 10 lb wt loss; admits to marked increase in sugar in the diet with drinking a 12 pl mello yello daily for over a month.  Sugar was > 600 at home by friends meter, seen in ED without DKA, started metformin 500 bid to which he has been taking well except for minor looser stools.  Needs new glucometer and supplies.  Pt denies chest pain, increased sob or doe, wheezing, orthopnea, PND, increased LE swelling, palpitations, dizziness or syncope.  Pt denies new neurological symptoms such as new headache, or facial or extremity weakness or numbness  Also c/o marked worsening anxiety as he was unfortunately laid off for last 8 wks, just now started to go back to work due to pandemic.  Associated with difficulty getting to sleep most night, does not want sleeping pill.  Also noted on recent labs is persistent mild elevated LFTs, with recent abd u/s c/w fatty liver.  Has had mild worsening daily reflux for which he is taking bc powders "only one per day" , abd pain, dysphagia, n/v, bowel change or blood Past Medical History:  Diagnosis Date  . Allergy    SEASONAL  . CAD S/P percutaneous coronary angioplasty 07/2014   [facility]: Abnormal Nuc --> Cath: pRCA 100% CTO (Med Rx), 90% OM2 --> PCI with 2 overlapping 2.5 x 8 DES (Promus & Xience)  . Cancer ([place])    skin cancer  . Depression   . Frequent headaches   . GERD (gastroesophageal reflux disease)   . Hyperlipidemia   . Hypertension   . Stomach ulcer    Past Surgical History:  Procedure Laterality Date  . BACK SURGERY  1993  . CARDIAC CATHETERIZATION  07/2014   [facility]: EF 55%. pRCA 100% CTO (Med Rx). OM2 90% -PCI   . CORONARY STENT INTERVENTION  07/2014   [facility]: OM2: Xience DES 2.5 x 8 & Promus DES 2.5 x 8 (? overlapping)  . MOUTH  SURGERY    . NM MYOVIEW LTD  06/2014   [facility]:  EF 55%, diminished exercise capacity w/ Angina. Large size, moderate severity reversible perfusion defect involving the basal and mid inferolateral walls, basal and mid inferior walls and inferoapex consistent with ischemia. --> Consider Cardiac Cath (done 07/2014)    reports that he has been smoking cigarettes. He has been smoking about 1.00 pack per day. He has never used smokeless tobacco. He reports that he does not drink alcohol or use drugs. family history includes Alcohol abuse in his father and mother; Hyperlipidemia in his father and mother; Hypertension in his maternal grandfather, maternal grandmother, and mother; Lung cancer in his father. No Known Allergies Current Outpatient Medications on File Prior to Visit  Medication Sig Dispense Refill  . Aspirin-Salicylamide-Caffeine (BC HEADACHE POWDER PO) Take 1 packet by mouth 3 (three) times daily as needed (pain).     Marland Kitchen atorvastatin (LIPITOR) 40 MG tablet Take 1 tablet (40 mg total) by mouth daily. 90 tablet 3  . b complex vitamins tablet Take 1 tablet by mouth daily.    . clotrimazole (LOTRIMIN) 1 % cream Apply to affected area 2 times daily 15 g 0  . fluticasone (FLONASE) 50 MCG/ACT nasal spray Place 2 sprays into both nostrils daily. 16 g 6  .  ibuprofen (ADVIL,MOTRIN) 200 MG tablet Take 800 mg by mouth every 8 (eight) hours as needed for mild pain.     Marland Kitchen lisinopril-hydrochlorothiazide (PRINZIDE,ZESTORETIC) 20-25 MG tablet Take 1 tablet by mouth daily. 90 tablet 3  . loratadine-pseudoephedrine (CLARITIN-D 12-HOUR) 5-120 MG tablet Take 1 tablet by mouth 2 (two) times daily.    Marland Kitchen omeprazole (PRILOSEC) 20 MG capsule Take 20 mg by mouth daily.    . tadalafil (CIALIS) 20 MG tablet Take 1 tablet (20 mg total) by mouth daily as needed for erectile dysfunction. 10 tablet 11   No current facility-administered medications on file prior to visit.    Review of Systems  Constitutional:  Negative for other unusual diaphoresis or sweats HENT: Negative for ear discharge or swelling Eyes: Negative for other worsening visual disturbances Respiratory: Negative for stridor or other swelling  Gastrointestinal: Negative for worsening distension or other blood Genitourinary: Negative for retention or other urinary change Musculoskeletal: Negative for other MSK pain or swelling Skin: Negative for color change or other new lesions Neurological: Negative for worsening tremors and other numbness  Psychiatric/Behavioral: Negative for worsening agitation or other fatigue All other system neg per pt    Objective:   Physical Exam BP 124/78   Pulse 100   Temp 98.3 F (36.8 C) (Oral)   Ht 5\' 11"  (1.803 m)   Wt 250 lb (113.4 kg)   SpO2 97%   BMI 34.87 kg/m  VS noted,  Constitutional: Pt appears in NAD HENT: Head: NCAT.  Right Ear: External ear normal.  Left Ear: External ear normal.  Eyes: . Pupils are equal, round, and reactive to light. Conjunctivae and EOM are normal Nose: without d/c or deformity Neck: Neck supple. Gross normal ROM Cardiovascular: Normal rate and regular rhythm.   Pulmonary/Chest: Effort normal and breath sounds without rales or wheezing.  Abd:  Soft, NT, ND, + BS, no organomegaly Neurological: Pt is alert. At baseline orientation, motor grossly intact Skin: Skin is warm. No rashes, other new lesions, no LE edema Psychiatric: Pt behavior is normal without agitation , 2+ nervous NO other exam findings  POCT glycosylated hemoglobin (Hb A1C) Order: [id number] Status:  Final result Visible to patient:  No (not released) Dx:  Type 2 diabetes mellitus without comp...  Ref Range & Units 13:50 80mo ago  Hemoglobin A1C 4.0 - 5.6 % 12.3Abnormal   6.9High  R, CM        Lab Results  Component Value Date   WBC 6.5 04/12/2019   HGB 17.3 (H) 04/12/2019   HCT 51.0 04/12/2019   PLT 167 04/12/2019   GLUCOSE 457 (H) 04/12/2019   CHOL 179 11/09/2018   TRIG 210.0  (H) 11/09/2018   HDL 32.30 (L) 11/09/2018   LDLDIRECT 121.0 11/09/2018   LDLCALC 72 07/11/2017   ALT 98 (H) 04/12/2019   AST 76 (H) 04/12/2019   NA 132 (L) 04/12/2019   K 3.8 04/12/2019   CL 97 (L) 04/12/2019   CREATININE 0.91 04/12/2019   BUN 11 04/12/2019   CO2 20 (L) 04/12/2019   TSH 1.84 11/09/2018   PSA 0.20 11/09/2018   HGBA1C 12.3 (A) 04/15/2019       Assessment & Plan:

## 2019-04-16 ENCOUNTER — Encounter: Payer: Self-pay | Admitting: Internal Medicine

## 2019-04-16 NOTE — Assessment & Plan Note (Signed)
Goal ldl < 70, declines further med change for now

## 2019-04-18 ENCOUNTER — Encounter: Payer: Self-pay | Admitting: Internal Medicine

## 2019-04-19 ENCOUNTER — Ambulatory Visit: Payer: BLUE CROSS/BLUE SHIELD | Admitting: Podiatry

## 2019-04-19 ENCOUNTER — Encounter: Payer: Self-pay | Admitting: Podiatry

## 2019-04-20 MED ORDER — GLIPIZIDE ER 10 MG PO TB24: 10.0000 mg | ORAL_TABLET | Freq: Every day | ORAL | 3 refills | Status: AC

## 2019-04-23 ENCOUNTER — Encounter: Payer: Self-pay | Admitting: Internal Medicine

## 2019-04-26 MED ORDER — ONETOUCH VERIO VI STRP: ORAL_STRIP | 12 refills | Status: AC

## 2019-04-26 MED ORDER — ONETOUCH DELICA PLUS LANCET30G MISC: 1.0000 | Freq: Four times a day (QID) | 3 refills | Status: AC

## 2019-04-26 NOTE — Addendum Note (Signed)
Addended by: Biagio Borg on: 04/26/2019 11:30 AM   Modules accepted: Orders

## 2019-04-27 ENCOUNTER — Encounter: Payer: Self-pay | Admitting: Internal Medicine

## 2019-05-10 ENCOUNTER — Encounter: Payer: Self-pay | Admitting: Internal Medicine

## 2019-05-10 DIAGNOSIS — H3530 Unspecified macular degeneration: Secondary | ICD-10-CM

## 2019-05-12 ENCOUNTER — Telehealth: Payer: Self-pay

## 2019-05-12 NOTE — Telephone Encounter (Signed)
Called patient to confirm appt needed to redo MRI order that was missed. "havn't set up date yet"; transferred to scheduler for appt

## 2019-05-22 ENCOUNTER — Encounter: Payer: Self-pay | Admitting: Internal Medicine

## 2019-05-25 DIAGNOSIS — H3530 Unspecified macular degeneration: Secondary | ICD-10-CM | POA: Diagnosis not present

## 2019-05-25 DIAGNOSIS — H2513 Age-related nuclear cataract, bilateral: Secondary | ICD-10-CM | POA: Diagnosis not present

## 2019-05-25 DIAGNOSIS — H353221 Exudative age-related macular degeneration, left eye, with active choroidal neovascularization: Secondary | ICD-10-CM | POA: Diagnosis not present

## 2019-05-25 DIAGNOSIS — H35361 Drusen (degenerative) of macula, right eye: Secondary | ICD-10-CM | POA: Diagnosis not present

## 2019-05-25 DIAGNOSIS — F17200 Nicotine dependence, unspecified, uncomplicated: Secondary | ICD-10-CM | POA: Diagnosis not present

## 2019-05-25 DIAGNOSIS — E119 Type 2 diabetes mellitus without complications: Secondary | ICD-10-CM | POA: Diagnosis not present

## 2019-06-07 IMAGING — DX DG CHEST 2V
2 series · 2 of 2 positions shown · non-contrast
Comparison: None.

CLINICAL DATA: Recurrent syncope.

EXAM:
CHEST  2 VIEW

[chest pa]
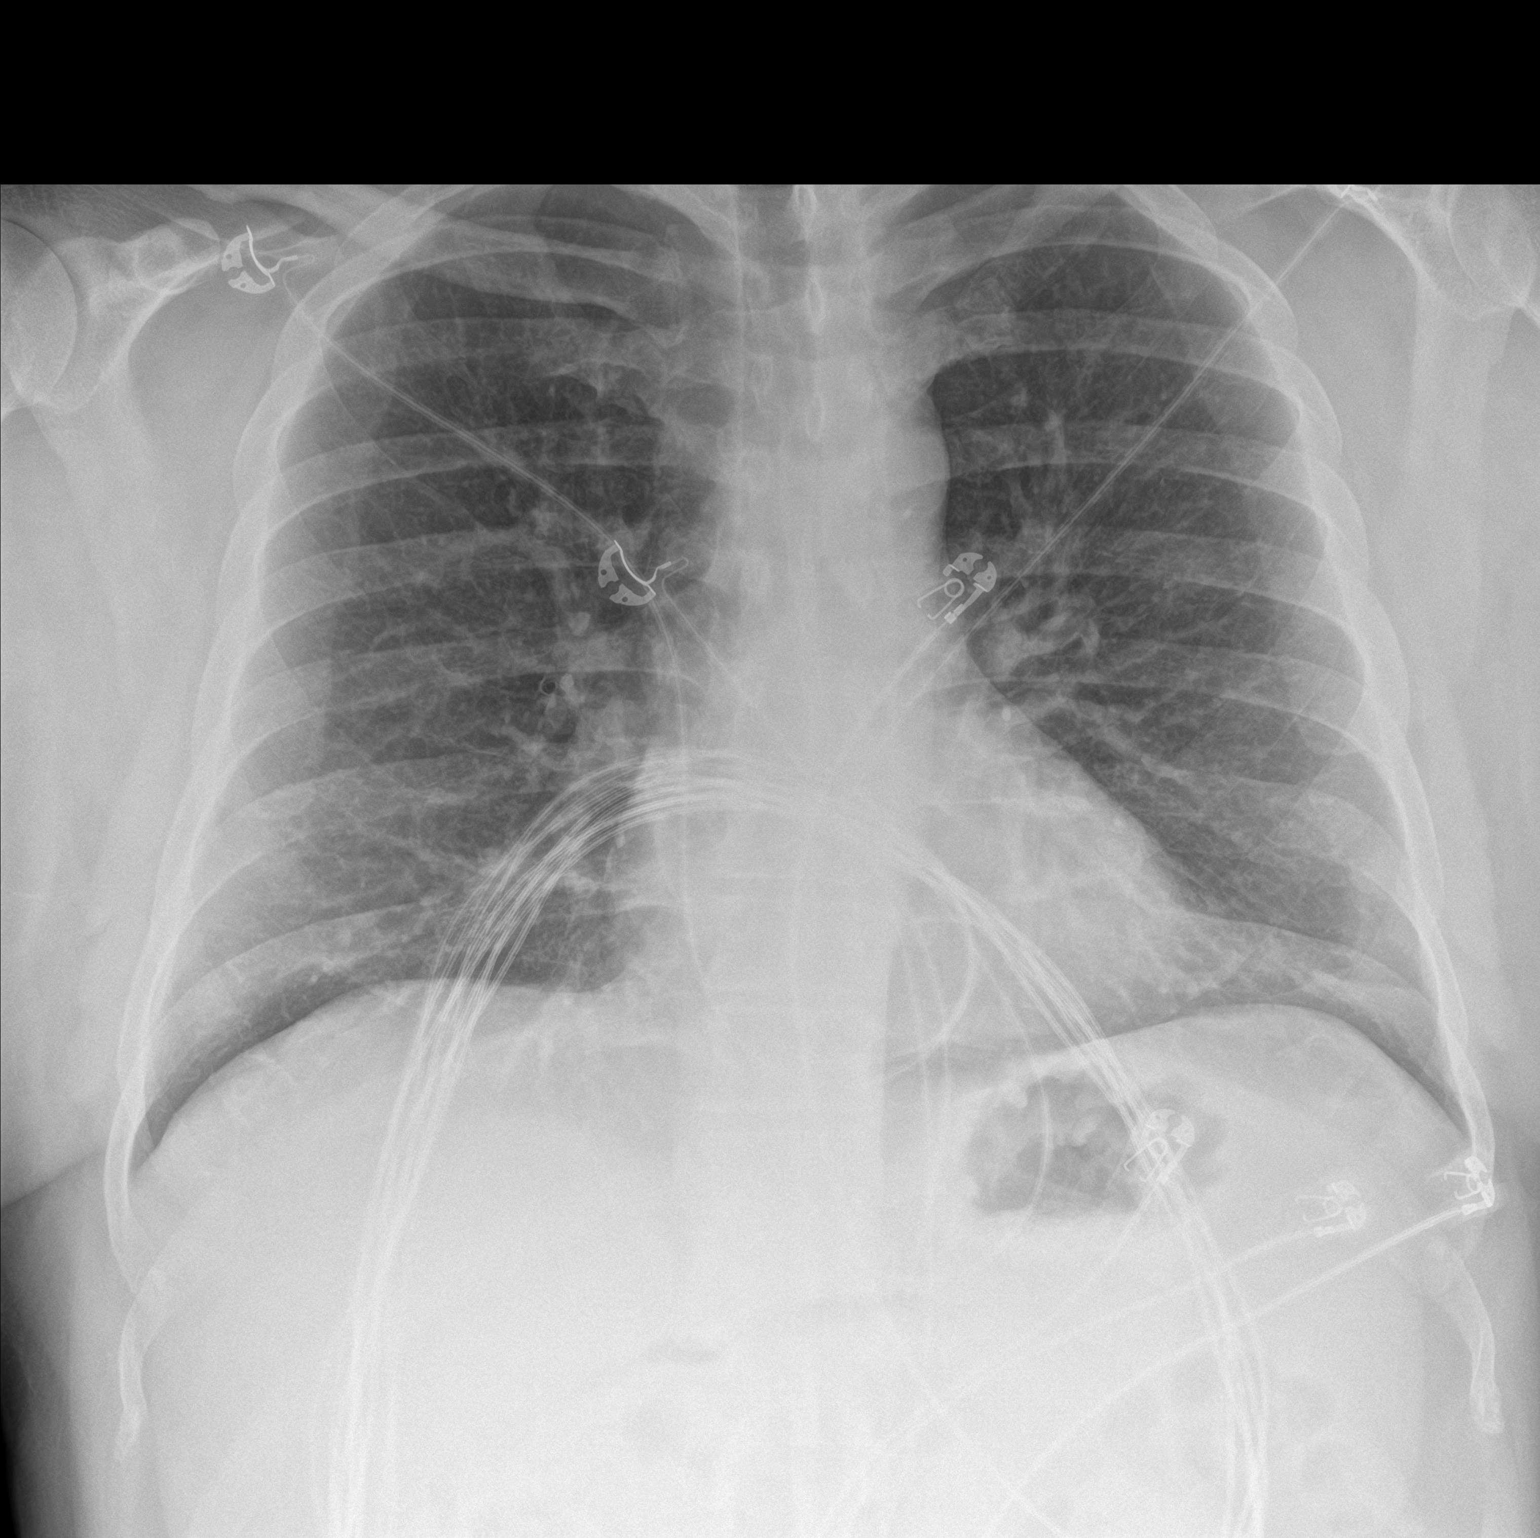

[chest lat]
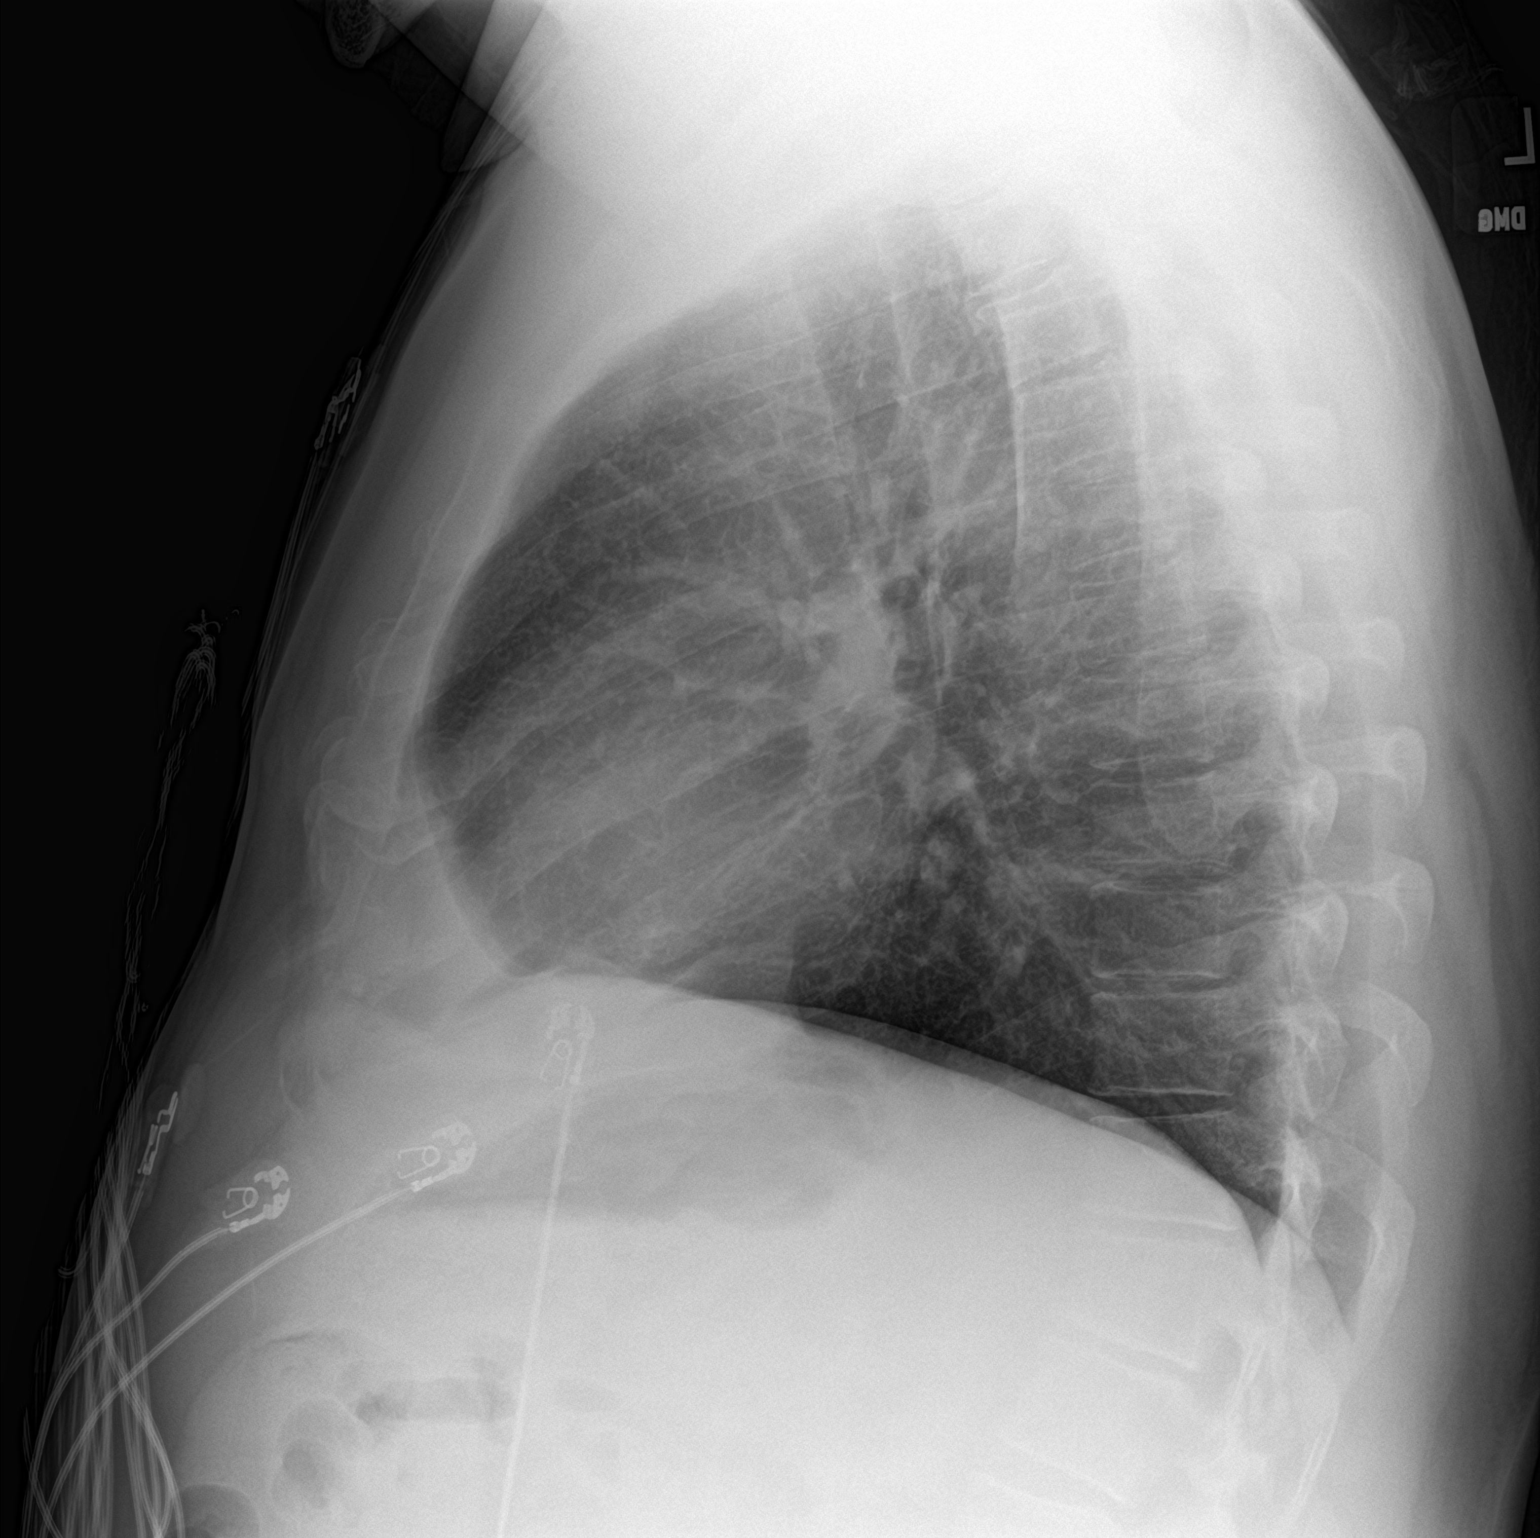

[2 of 2 positions shown; findings below may reference images not displayed]

FINDINGS: The cardiomediastinal contours are normal. The lungs are clear.
Pulmonary vasculature is normal. No consolidation, pleural effusion,
or pneumothorax. No acute osseous abnormalities are seen.
IMPRESSION: No acute pulmonary process.

## 2019-07-10 ENCOUNTER — Other Ambulatory Visit: Payer: Self-pay | Admitting: Internal Medicine

## 2019-07-12 MED ORDER — GLIMEPIRIDE 4 MG PO TABS: 4.0000 mg | ORAL_TABLET | Freq: Every day | ORAL | 2 refills | Status: AC

## 2019-07-12 NOTE — Telephone Encounter (Signed)
Done erx 

## 2019-07-12 NOTE — Telephone Encounter (Signed)
Pharmacy comment: Alternative Requested:PT PAYING $ 20 FOR GLIPIZIDE ER 10. PT REQUESTING LOWER OUT-OF-POCKET COST. SEE ALTERNATIVES AND ASSOCIATED COST: GLIPIZIDE 10 $4; GLIMEPIRIDE 4 $10.

## 2019-07-17 ENCOUNTER — Other Ambulatory Visit: Payer: Self-pay | Admitting: Internal Medicine

## 2019-07-19 ENCOUNTER — Ambulatory Visit: Payer: BC Managed Care – PPO | Admitting: Internal Medicine

## 2019-07-19 NOTE — Telephone Encounter (Signed)
Done erx 

## 2019-10-28 ENCOUNTER — Other Ambulatory Visit: Payer: Self-pay | Admitting: Internal Medicine

## 2019-10-28 DIAGNOSIS — I10 Essential (primary) hypertension: Secondary | ICD-10-CM

## 2019-11-11 ENCOUNTER — Encounter: Payer: BLUE CROSS/BLUE SHIELD | Admitting: Internal Medicine

## 2019-11-25 ENCOUNTER — Encounter: Payer: Self-pay | Admitting: General Practice

## 2019-12-24 ENCOUNTER — Other Ambulatory Visit: Payer: Self-pay | Admitting: Internal Medicine

## 2020-01-24 ENCOUNTER — Other Ambulatory Visit: Payer: Self-pay | Admitting: Internal Medicine

## 2020-01-24 DIAGNOSIS — I10 Essential (primary) hypertension: Secondary | ICD-10-CM

## 2020-02-03 ENCOUNTER — Other Ambulatory Visit: Payer: Self-pay | Admitting: Internal Medicine

## 2020-02-03 NOTE — Telephone Encounter (Signed)
Please refill as per office routine med refill policy (all routine meds refilled for 3 mo or monthly per pt preference up to one year from last visit, then month to month grace period for 3 mo, then further med refills will have to be denied)  

## 2020-09-14 ENCOUNTER — Telehealth: Payer: Self-pay

## 2020-09-14 NOTE — Telephone Encounter (Signed)
Contacted patient for lung CT screening program based on referral from [facility] Friendly.  When patient was referred, the program requirements were for ages 32 to 28.  Requirements have now changed to ages 43 to 20.  I left message with patient to call Burgess Estelle, lung navigator to schedule CT scan.

## 2020-10-03 ENCOUNTER — Encounter: Payer: Self-pay | Admitting: *Deleted

## 2020-10-03 ENCOUNTER — Telehealth: Payer: Self-pay | Admitting: *Deleted

## 2020-10-03 NOTE — Telephone Encounter (Signed)
Received referral for low dose lung cancer screening CT scan. Message left at phone number listed in EMR for patient to call me back to facilitate scheduling scan.
